# Patient Record
Sex: Male | Born: 1970 | Race: White | Hispanic: No | Smoking: Former smoker
Health system: Southern US, Community
[De-identification: ages and names within clinical notes are randomized; demographics above are authoritative.]

## PROBLEM LIST (undated history)

## (undated) DIAGNOSIS — M1990 Unspecified osteoarthritis, unspecified site: Secondary | ICD-10-CM

## (undated) DIAGNOSIS — E119 Type 2 diabetes mellitus without complications: Secondary | ICD-10-CM

## (undated) DIAGNOSIS — I10 Essential (primary) hypertension: Secondary | ICD-10-CM

## (undated) DIAGNOSIS — I219 Acute myocardial infarction, unspecified: Secondary | ICD-10-CM

## (undated) DIAGNOSIS — I2119 ST elevation (STEMI) myocardial infarction involving other coronary artery of inferior wall: Secondary | ICD-10-CM

## (undated) DIAGNOSIS — R0981 Nasal congestion: Secondary | ICD-10-CM

## (undated) DIAGNOSIS — I2510 Atherosclerotic heart disease of native coronary artery without angina pectoris: Secondary | ICD-10-CM

## (undated) DIAGNOSIS — R7303 Prediabetes: Secondary | ICD-10-CM

## (undated) DIAGNOSIS — I340 Nonrheumatic mitral (valve) insufficiency: Secondary | ICD-10-CM

## (undated) DIAGNOSIS — Z951 Presence of aortocoronary bypass graft: Secondary | ICD-10-CM

## (undated) DIAGNOSIS — Z8249 Family history of ischemic heart disease and other diseases of the circulatory system: Secondary | ICD-10-CM

## (undated) DIAGNOSIS — E785 Hyperlipidemia, unspecified: Secondary | ICD-10-CM

## (undated) HISTORY — DX: Type 2 diabetes mellitus without complications: E11.9

## (undated) HISTORY — DX: Hyperlipidemia, unspecified: E78.5

## (undated) HISTORY — DX: Acute myocardial infarction, unspecified: I21.9

## (undated) HISTORY — PX: CARDIAC CATHETERIZATION: SHX172

## (undated) HISTORY — PX: CARPAL TUNNEL RELEASE: SHX101

## (undated) HISTORY — PX: WISDOM TOOTH EXTRACTION: SHX21

## (undated) HISTORY — DX: ST elevation (STEMI) myocardial infarction involving other coronary artery of inferior wall: I21.19

## (undated) HISTORY — DX: Nonrheumatic mitral (valve) insufficiency: I34.0

## (undated) HISTORY — DX: Family history of ischemic heart disease and other diseases of the circulatory system: Z82.49

---

## 2017-04-25 ENCOUNTER — Encounter (HOSPITAL_COMMUNITY): Payer: Self-pay | Admitting: *Deleted

## 2017-04-25 ENCOUNTER — Encounter (HOSPITAL_COMMUNITY)
Admission: EM | Disposition: A | Discharge status: Home / Self Care | Payer: Self-pay | Source: Home / Self Care | Attending: Interventional Cardiology

## 2017-04-25 ENCOUNTER — Inpatient Hospital Stay (HOSPITAL_COMMUNITY)
Admission: EM | Admit: 2017-04-25 | Discharge: 2017-04-27 | DRG: 247 | Disposition: A | Discharge status: Home / Self Care | Payer: BLUE CROSS/BLUE SHIELD | Attending: Interventional Cardiology | Admitting: Interventional Cardiology

## 2017-04-25 DIAGNOSIS — I2510 Atherosclerotic heart disease of native coronary artery without angina pectoris: Secondary | ICD-10-CM | POA: Diagnosis not present

## 2017-04-25 DIAGNOSIS — Z23 Encounter for immunization: Secondary | ICD-10-CM

## 2017-04-25 DIAGNOSIS — E119 Type 2 diabetes mellitus without complications: Secondary | ICD-10-CM | POA: Diagnosis not present

## 2017-04-25 DIAGNOSIS — I2119 ST elevation (STEMI) myocardial infarction involving other coronary artery of inferior wall: Principal | ICD-10-CM | POA: Diagnosis present

## 2017-04-25 DIAGNOSIS — Z955 Presence of coronary angioplasty implant and graft: Secondary | ICD-10-CM

## 2017-04-25 DIAGNOSIS — I219 Acute myocardial infarction, unspecified: Secondary | ICD-10-CM

## 2017-04-25 DIAGNOSIS — Z8249 Family history of ischemic heart disease and other diseases of the circulatory system: Secondary | ICD-10-CM

## 2017-04-25 DIAGNOSIS — E785 Hyperlipidemia, unspecified: Secondary | ICD-10-CM

## 2017-04-25 DIAGNOSIS — F17290 Nicotine dependence, other tobacco product, uncomplicated: Secondary | ICD-10-CM | POA: Diagnosis not present

## 2017-04-25 DIAGNOSIS — I252 Old myocardial infarction: Secondary | ICD-10-CM | POA: Diagnosis not present

## 2017-04-25 DIAGNOSIS — Z716 Tobacco abuse counseling: Secondary | ICD-10-CM

## 2017-04-25 HISTORY — PX: LEFT HEART CATH AND CORONARY ANGIOGRAPHY: CATH118249

## 2017-04-25 HISTORY — DX: Family history of ischemic heart disease and other diseases of the circulatory system: Z82.49

## 2017-04-25 HISTORY — DX: Hyperlipidemia, unspecified: E78.5

## 2017-04-25 HISTORY — DX: ST elevation (STEMI) myocardial infarction involving other coronary artery of inferior wall: I21.19

## 2017-04-25 HISTORY — DX: Acute myocardial infarction, unspecified: I21.9

## 2017-04-25 HISTORY — PX: CORONARY/GRAFT ACUTE MI REVASCULARIZATION: CATH118305

## 2017-04-25 LAB — COMPREHENSIVE METABOLIC PANEL
ALK PHOS: 75 U/L (ref 38–126)
ALT: 41 U/L (ref 17–63)
AST: 98 U/L — AB (ref 15–41)
Albumin: 3.6 g/dL (ref 3.5–5.0)
Anion gap: 12 (ref 5–15)
BUN: 13 mg/dL (ref 6–20)
CALCIUM: 8.1 mg/dL — AB (ref 8.9–10.3)
CHLORIDE: 100 mmol/L — AB (ref 101–111)
CO2: 19 mmol/L — AB (ref 22–32)
CREATININE: 1.1 mg/dL (ref 0.61–1.24)
GFR calc non Af Amer: >60 mL/min (ref >60)
GLUCOSE: 251 mg/dL — AB (ref 65–99)
Potassium: 3.5 mmol/L (ref 3.5–5.1)
SODIUM: 131 mmol/L — AB (ref 135–145)
Total Bilirubin: 0.8 mg/dL (ref 0.3–1.2)
Total Protein: 5.7 g/dL — ABNORMAL LOW (ref 6.5–8.1)

## 2017-04-25 LAB — CBC
HCT: 38.2 % — ABNORMAL LOW (ref 39.0–52.0)
HCT: 38.7 % — ABNORMAL LOW (ref 39.0–52.0)
HEMOGLOBIN: 13.9 g/dL (ref 13.0–17.0)
Hemoglobin: 13 g/dL (ref 13.0–17.0)
MCH: 32.2 pg (ref 26.0–34.0)
MCH: 32.7 pg (ref 26.0–34.0)
MCHC: 34 g/dL (ref 30.0–36.0)
MCHC: 35.9 g/dL (ref 30.0–36.0)
MCV: 94.6 fL (ref 78.0–100.0)
MCV: 91.1 fL (ref 78.0–100.0)
PLATELETS: 189 10*3/uL (ref 150–400)
Platelets: 189 10*3/uL (ref 150–400)
RBC: 4.04 MIL/uL — AB (ref 4.22–5.81)
RBC: 4.25 MIL/uL (ref 4.22–5.81)
RDW: 11.9 % (ref 11.5–15.5)
RDW: 12 % (ref 11.5–15.5)
WBC: 8 10*3/uL (ref 4.0–10.5)
WBC: 10 10*3/uL (ref 4.0–10.5)

## 2017-04-25 LAB — LIPID PANEL
Cholesterol: 201 mg/dL — ABNORMAL HIGH (ref 0–200)
HDL: 24 mg/dL — ABNORMAL LOW (ref >40)
LDL CALC: UNDETERMINED mg/dL (ref 0–99)
Total CHOL/HDL Ratio: 8.4 RATIO
Triglycerides: 507 mg/dL — ABNORMAL HIGH (ref <150)
VLDL: UNDETERMINED mg/dL (ref 0–40)

## 2017-04-25 LAB — CREATININE, SERUM
CREATININE: 1.2 mg/dL (ref 0.61–1.24)
GFR calc Af Amer: >60 mL/min (ref >60)
GFR calc non Af Amer: >60 mL/min (ref >60)

## 2017-04-25 LAB — APTT

## 2017-04-25 LAB — TROPONIN I: TROPONIN I: 9.2 ng/mL — AB (ref <0.03)

## 2017-04-25 LAB — PROTIME-INR
INR: 1.17
PROTHROMBIN TIME: 14.8 s (ref 11.4–15.2)

## 2017-04-25 LAB — MRSA PCR SCREENING: MRSA by PCR: NEGATIVE

## 2017-04-25 LAB — HEMOGLOBIN A1C
HEMOGLOBIN A1C: 8.3 % — AB (ref 4.8–5.6)
MEAN PLASMA GLUCOSE: 191.51 mg/dL

## 2017-04-25 SURGERY — CORONARY/GRAFT ACUTE MI REVASCULARIZATION
Anesthesia: LOCAL

## 2017-04-25 MED ORDER — MORPHINE SULFATE (PF) 4 MG/ML IV SOLN
2.0000 mg | Freq: Four times a day (QID) | INTRAVENOUS | Status: DC | PRN
  Administered 2017-04-25 (×2): 2 mg via INTRAVENOUS
  Filled 2017-04-25 (×2): qty 1

## 2017-04-25 MED ORDER — HEPARIN (PORCINE) IN NACL 2-0.9 UNIT/ML-% IJ SOLN
INTRAMUSCULAR | Status: DC | PRN
  Administered 2017-04-25: 10 mL via INTRA_ARTERIAL

## 2017-04-25 MED ORDER — LABETALOL HCL 5 MG/ML IV SOLN: 10.0000 mg | INTRAVENOUS | Status: AC | PRN

## 2017-04-25 MED ORDER — MIDAZOLAM HCL 2 MG/2ML IJ SOLN
INTRAMUSCULAR | Status: AC
  Filled 2017-04-25: qty 2

## 2017-04-25 MED ORDER — NITROGLYCERIN 1 MG/10 ML FOR IR/CATH LAB
INTRA_ARTERIAL | Status: AC
  Filled 2017-04-25: qty 10

## 2017-04-25 MED ORDER — HEPARIN (PORCINE) IN NACL 2-0.9 UNIT/ML-% IJ SOLN
INTRAMUSCULAR | Status: AC | PRN
  Administered 2017-04-25: 1000 mL

## 2017-04-25 MED ORDER — FENTANYL CITRATE (PF) 100 MCG/2ML IJ SOLN
INTRAMUSCULAR | Status: AC
  Filled 2017-04-25: qty 2

## 2017-04-25 MED ORDER — ASPIRIN 81 MG PO CHEW
81.0000 mg | CHEWABLE_TABLET | Freq: Every day | ORAL | Status: DC
  Administered 2017-04-26 – 2017-04-27 (×2): 81 mg via ORAL
  Filled 2017-04-25 (×2): qty 1

## 2017-04-25 MED ORDER — LIDOCAINE HCL (PF) 1 % IJ SOLN
INTRAMUSCULAR | Status: DC | PRN
  Administered 2017-04-25: 2 mL

## 2017-04-25 MED ORDER — IOPAMIDOL (ISOVUE-370) INJECTION 76%
INTRAVENOUS | Status: DC | PRN
  Administered 2017-04-25: 175 mL

## 2017-04-25 MED ORDER — HEPARIN SODIUM (PORCINE) 1000 UNIT/ML IJ SOLN
INTRAMUSCULAR | Status: DC | PRN
  Administered 2017-04-25: 12000 [IU] via INTRAVENOUS
  Administered 2017-04-25: 2500 [IU] via INTRAVENOUS

## 2017-04-25 MED ORDER — ONDANSETRON HCL 4 MG/2ML IJ SOLN: 4.0000 mg | Freq: Four times a day (QID) | INTRAMUSCULAR | Status: DC | PRN

## 2017-04-25 MED ORDER — NITROGLYCERIN 1 MG/10 ML FOR IR/CATH LAB
INTRA_ARTERIAL | Status: DC | PRN
  Administered 2017-04-25: 200 ug via INTRACORONARY

## 2017-04-25 MED ORDER — HEPARIN (PORCINE) IN NACL 2-0.9 UNIT/ML-% IJ SOLN
INTRAMUSCULAR | Status: AC
  Filled 2017-04-25: qty 1500

## 2017-04-25 MED ORDER — SODIUM CHLORIDE 0.9% FLUSH
3.0000 mL | Freq: Two times a day (BID) | INTRAVENOUS | Status: DC
  Administered 2017-04-26 – 2017-04-27 (×2): 3 mL via INTRAVENOUS

## 2017-04-25 MED ORDER — HYDRALAZINE HCL 20 MG/ML IJ SOLN: 5.0000 mg | INTRAMUSCULAR | Status: AC | PRN

## 2017-04-25 MED ORDER — TICAGRELOR 90 MG PO TABS
ORAL_TABLET | ORAL | Status: DC | PRN
  Administered 2017-04-25: 180 mg via ORAL

## 2017-04-25 MED ORDER — OXYCODONE HCL 5 MG PO TABS
5.0000 mg | ORAL_TABLET | ORAL | Status: DC | PRN
  Administered 2017-04-25 (×2): 10 mg via ORAL
  Filled 2017-04-25 (×2): qty 2

## 2017-04-25 MED ORDER — SODIUM CHLORIDE 0.9% FLUSH: 3.0000 mL | INTRAVENOUS | Status: DC | PRN

## 2017-04-25 MED ORDER — INFLUENZA VAC SPLIT QUAD 0.5 ML IM SUSY: 0.5000 mL | PREFILLED_SYRINGE | INTRAMUSCULAR | Status: DC

## 2017-04-25 MED ORDER — FENTANYL CITRATE (PF) 100 MCG/2ML IJ SOLN
INTRAMUSCULAR | Status: DC | PRN
  Administered 2017-04-25: 25 ug via INTRAVENOUS
  Administered 2017-04-25 (×2): 50 ug via INTRAVENOUS

## 2017-04-25 MED ORDER — SODIUM CHLORIDE 0.9 % WEIGHT BASED INFUSION: 0.5000 mL/kg/h | INTRAVENOUS | Status: AC

## 2017-04-25 MED ORDER — ATORVASTATIN CALCIUM 80 MG PO TABS
80.0000 mg | ORAL_TABLET | Freq: Every day | ORAL | Status: DC
  Administered 2017-04-25 – 2017-04-26 (×2): 80 mg via ORAL
  Filled 2017-04-25 (×2): qty 1

## 2017-04-25 MED ORDER — IOPAMIDOL (ISOVUE-370) INJECTION 76%
INTRAVENOUS | Status: AC
  Filled 2017-04-25: qty 125

## 2017-04-25 MED ORDER — MIDAZOLAM HCL 2 MG/2ML IJ SOLN
INTRAMUSCULAR | Status: DC | PRN
  Administered 2017-04-25 (×3): 1 mg via INTRAVENOUS

## 2017-04-25 MED ORDER — VERAPAMIL HCL 2.5 MG/ML IV SOLN
INTRAVENOUS | Status: AC
  Filled 2017-04-25: qty 2

## 2017-04-25 MED ORDER — SODIUM CHLORIDE 0.9 % IV SOLN: 250.0000 mL | INTRAVENOUS | Status: DC | PRN

## 2017-04-25 MED ORDER — CARVEDILOL 3.125 MG PO TABS
3.1250 mg | ORAL_TABLET | Freq: Two times a day (BID) | ORAL | Status: DC
  Administered 2017-04-25: 3.125 mg via ORAL
  Filled 2017-04-25: qty 1

## 2017-04-25 MED ORDER — HEPARIN SODIUM (PORCINE) 5000 UNIT/ML IJ SOLN
5000.0000 [IU] | Freq: Three times a day (TID) | INTRAMUSCULAR | Status: DC
  Administered 2017-04-25 – 2017-04-27 (×5): 5000 [IU] via SUBCUTANEOUS
  Filled 2017-04-25 (×5): qty 1

## 2017-04-25 MED ORDER — TICAGRELOR 90 MG PO TABS
ORAL_TABLET | ORAL | Status: AC
  Filled 2017-04-25: qty 2

## 2017-04-25 MED ORDER — HEPARIN SODIUM (PORCINE) 1000 UNIT/ML IJ SOLN
INTRAMUSCULAR | Status: AC
  Filled 2017-04-25: qty 1

## 2017-04-25 MED ORDER — LIDOCAINE HCL (PF) 1 % IJ SOLN
INTRAMUSCULAR | Status: AC
  Filled 2017-04-25: qty 30

## 2017-04-25 MED ORDER — ACETAMINOPHEN 325 MG PO TABS
650.0000 mg | ORAL_TABLET | ORAL | Status: DC | PRN
  Administered 2017-04-25: 650 mg via ORAL
  Filled 2017-04-25: qty 2

## 2017-04-25 MED ORDER — TICAGRELOR 90 MG PO TABS
90.0000 mg | ORAL_TABLET | Freq: Two times a day (BID) | ORAL | Status: DC
  Administered 2017-04-25 – 2017-04-27 (×4): 90 mg via ORAL
  Filled 2017-04-25 (×4): qty 1

## 2017-04-25 SURGICAL SUPPLY — 19 items
BALLN SAPPHIRE 2.5X12 (BALLOONS) ×2
BALLN SAPPHIRE ~~LOC~~ 3.25X12 (BALLOONS) ×2 IMPLANT
BALLOON SAPPHIRE 2.5X12 (BALLOONS) ×1 IMPLANT
CATH INFINITI JR4 5F (CATHETERS) ×2 IMPLANT
CATH LAUNCHER 6FR JR4 (CATHETERS) ×2 IMPLANT
CATH VISTA GUIDE 6FR XB3.5 (CATHETERS) ×2 IMPLANT
COVER PRB 48X5XTLSCP FOLD TPE (BAG) ×1 IMPLANT
COVER PROBE 5X48 (BAG) ×1
DEVICE RAD COMP TR BAND LRG (VASCULAR PRODUCTS) ×2 IMPLANT
GLIDESHEATH SLEND A-KIT 6F 22G (SHEATH) ×2 IMPLANT
GUIDEWIRE INQWIRE 1.5J.035X260 (WIRE) ×1 IMPLANT
INQWIRE 1.5J .035X260CM (WIRE) ×2
KIT ENCORE 26 ADVANTAGE (KITS) ×2 IMPLANT
KIT HEART LEFT (KITS) ×2 IMPLANT
PACK CARDIAC CATHETERIZATION (CUSTOM PROCEDURE TRAY) ×2 IMPLANT
STENT PROMUS PREM MR 3.0X20 (Permanent Stent) ×2 IMPLANT
TRANSDUCER W/STOPCOCK (MISCELLANEOUS) ×2 IMPLANT
TUBING CIL FLEX 10 FLL-RA (TUBING) ×2 IMPLANT
WIRE ASAHI PROWATER 180CM (WIRE) ×6 IMPLANT

## 2017-04-25 NOTE — Progress Notes (Signed)
Pt continues having 4/10 intermittent CP which he describes as "achey". VSS. No EKG changes. Given PRN PO meds. APP paged.

## 2017-04-25 NOTE — H&P (Signed)
Cardiology Admission History and Physical:   Patient ID: Adrian Graham; MRN: [id number]; DOB: 1970/12/20   Admission date: 04/25/2017  Primary Care Provider: No primary care provider on file. Primary Cardiologist: Mendel RyderH.  Nysir Fergusson (new) Primary Electrophysiologist: None  Chief Complaint: Chest pain  Patient Profile:   Adrian Graham is a 46 y.o. male with a history of acute inferolateral myocardial infarction on presentation with no prior significant medical illnesses and on no medical therapy.  Significant family history of premature ischemic heart disease with father dying at age 45 and her brother suffering a myocardial infarction in his late 7330s.  History of Present Illness:   Adrian Graham was with his brother hunting and just positioned himself in his deer stand when he developed chest discomfort at around 6:30 AM.  EMS was called and initial EKG demonstrated inferolateral ST segment elevation.  He was transported to the emergency room where brief evaluation was consistent with ongoing severe pain and continued EKG changes.  The patient smokes cigarettes.  He is on no chronic medications.  He has no known medical illnesses.   History reviewed. No pertinent past medical history.  History reviewed. No pertinent surgical history.   Medications Prior to Admission: Prior to Admission medications   Not on File     Allergies:   Allergies no known allergies  Social History:   Social History   Social History  . Marital status: Single    Spouse name: N/A  . Number of children: N/A  . Years of education: N/A   Occupational History  . Not on file.   Social History Main Topics  . Smoking status: Current Every Day Smoker    Types: E-cigarettes  . Smokeless tobacco: Current User  . Alcohol use Yes     Comment: Rare  . Drug use: No  . Sexual activity: Yes   Other Topics Concern  . Not on file   Social History Narrative  . No narrative on file    Family History: The father died  of a myocardial infarction at 4846.  A brother had an MI in his late 1530s. The patient's family history is not on file.    ROS:  Please see the history of present illness.  Smokeless tobacco/vaping.  Prior tobacco use.   All other ROS reviewed and negative.     Physical Exam/Data:   Vitals:   04/25/17 0910 04/25/17 0915 04/25/17 0946 04/25/17 1000  BP: 122/83 113/78    Pulse: 67 77 81 72  Resp: (!) 22 (!) 21 14 15   SpO2: 99% 99% 100% 100%   No intake or output data in the 24 hours ending 04/25/17 1016 There were no vitals filed for this visit. There is no height or weight on file to calculate BMI.  General:  Well nourished, well developed, in severe pain, writhing on the stretcher.  Skin is cool. HEENT: normal Lymph: no adenopathy Neck: no carotid bruit or JVD Endocrine:  No thryomegaly Vascular: No carotid bruits; FA pulses 2+ bilaterally without bruits.  Radial pulses are 2+ and pedal pulses are 2+. Cardiac:  normal S1, S2; RRR; no murmur or pericardial rub is heard. Lungs:  clear to auscultation bilaterally, no wheezing, rhonchi or rales  Abd: soft, nontender, no hepatomegaly  Ext: no edema Musculoskeletal:  No deformities, BUE and BLE strength normal and equal Skin: warm and dry  Neuro:  CNs 2-12 intact, no focal abnormalities noted Psych:  Normal affect    EKG:  The ECG  that was done EMS was personally reviewed and demonstrates ST elevation 2 3 aVF V4 through V6.  Findings are compatible with inferolateral acute ischemia.  Relevant CV Studies: None available  Laboratory Data:  ChemistryNo results for input(s): NA, K, CL, CO2, GLUCOSE, BUN, CREATININE, CALCIUM, GFRNONAA, GFRAA, ANIONGAP in the last 168 hours.  No results for input(s): PROT, ALBUMIN, AST, ALT, ALKPHOS, BILITOT in the last 168 hours. Hematology  Recent Labs Lab 04/25/17 0909  WBC 8.0  RBC 4.04*  HGB 13.0  HCT 38.2*  MCV 94.6  MCH 32.2  MCHC 34.0  RDW 11.9  PLT 189   Cardiac EnzymesNo results  for input(s): TROPONINI in the last 168 hours. No results for input(s): TROPIPOC in the last 168 hours.  BNPNo results for input(s): BNP, PROBNP in the last 168 hours.  DDimer No results for input(s): DDIMER in the last 168 hours.  Radiology/Studies:  No results found.  Assessment and Plan:   1. Acute inferolateral myocardial infarction with severe ongoing pain upon arrival.  The patient was brought immediately to the Cath Lab for angiography and mechanical intervention if feasible.  Emergency consent was obtained from the patient with the knowledge that the procedure carries a risk of death, myocardial infarction, stroke, and bleeding.  Less commonly kidney injury and infection.  Onset of discomfort approximately 2 hours prior to arrival. 2. Presumed hyperlipidemia 3. Significant family history for premature atherosclerosis.  CRITICAL CARE TIME:  25 minutes  Severity of Illness: The appropriate patient status for this patient is INPATIENT. Inpatient status is judged to be reasonable and necessary in order to provide the required intensity of service to ensure the patient's safety. The patient's presenting symptoms, physical exam findings, and initial radiographic and laboratory data in the context of their chronic comorbidities is felt to place them at high risk for further clinical deterioration. Furthermore, it is not anticipated that the patient will be medically stable for discharge from the hospital within 2 midnights of admission. The following factors support the patient status of inpatient.   " The patient's presenting symptoms include ongoing chest pain. " The worrisome physical exam findings include elevated blood pressure and EKG evidence of acute inferior infarction. " The initial radiographic and laboratory data are worrisome because of none other than EKG. " The chronic co-morbidities include none recorded.   * I certify that at the point of admission it is my clinical judgment  that the patient will require inpatient hospital care spanning beyond 2 midnights from the point of admission due to high intensity of service, high risk for further deterioration and high frequency of surveillance required.*    For questions or updates, please contact [facility] Please consult [email or web address] for contact info under Cardiology/STEMI.    Signed, Lesleigh Noe, MD  04/25/2017 10:16 AM

## 2017-04-25 NOTE — Progress Notes (Signed)
Brilinta Card givenm to patient. Patient assistance paperwork at the bedside. MD to sign.

## 2017-04-26 ENCOUNTER — Other Ambulatory Visit: Payer: Self-pay

## 2017-04-26 LAB — BASIC METABOLIC PANEL
ANION GAP: 10 (ref 5–15)
BUN: 10 mg/dL (ref 6–20)
CHLORIDE: 100 mmol/L — AB (ref 101–111)
CO2: 20 mmol/L — ABNORMAL LOW (ref 22–32)
Calcium: 8.6 mg/dL — ABNORMAL LOW (ref 8.9–10.3)
Creatinine, Ser: 1.06 mg/dL (ref 0.61–1.24)
Glucose, Bld: 210 mg/dL — ABNORMAL HIGH (ref 65–99)
POTASSIUM: 3.7 mmol/L (ref 3.5–5.1)
SODIUM: 130 mmol/L — AB (ref 135–145)

## 2017-04-26 LAB — CBC
HEMATOCRIT: 37.3 % — AB (ref 39.0–52.0)
HEMOGLOBIN: 12.9 g/dL — AB (ref 13.0–17.0)
MCH: 31.9 pg (ref 26.0–34.0)
MCHC: 34.6 g/dL (ref 30.0–36.0)
MCV: 92.3 fL (ref 78.0–100.0)
Platelets: 187 10*3/uL (ref 150–400)
RBC: 4.04 MIL/uL — AB (ref 4.22–5.81)
RDW: 12.4 % (ref 11.5–15.5)
WBC: 9.9 10*3/uL (ref 4.0–10.5)

## 2017-04-26 LAB — POCT ACTIVATED CLOTTING TIME
ACTIVATED CLOTTING TIME: 445 s
Activated Clotting Time: 268 seconds

## 2017-04-26 LAB — GLUCOSE, CAPILLARY
GLUCOSE-CAPILLARY: 239 mg/dL — AB (ref 65–99)
GLUCOSE-CAPILLARY: 185 mg/dL — AB (ref 65–99)

## 2017-04-26 LAB — POCT I-STAT, CHEM 8
BUN: 16 mg/dL (ref 6–20)
CHLORIDE: 103 mmol/L (ref 101–111)
CREATININE: 1.1 mg/dL (ref 0.61–1.24)
Calcium, Ion: 1.24 mmol/L (ref 1.15–1.40)
GLUCOSE: 275 mg/dL — AB (ref 65–99)
HEMATOCRIT: 41 % (ref 39.0–52.0)
Hemoglobin: 13.9 g/dL (ref 13.0–17.0)
POTASSIUM: 3.5 mmol/L (ref 3.5–5.1)
Sodium: 136 mmol/L (ref 135–145)
TCO2: 22 mmol/L (ref 22–32)

## 2017-04-26 LAB — TROPONIN I: Troponin I: >65 ng/mL (ref <0.03)

## 2017-04-26 MED ORDER — CARVEDILOL 6.25 MG PO TABS
6.2500 mg | ORAL_TABLET | Freq: Two times a day (BID) | ORAL | Status: DC
  Administered 2017-04-26 – 2017-04-27 (×2): 6.25 mg via ORAL
  Filled 2017-04-26 (×2): qty 1

## 2017-04-26 MED ORDER — LIVING WELL WITH DIABETES BOOK
Freq: Once | Status: AC
  Administered 2017-04-26: 15:00:00
  Filled 2017-04-26: qty 1

## 2017-04-26 MED ORDER — CARVEDILOL 6.25 MG PO TABS
6.2500 mg | ORAL_TABLET | Freq: Once | ORAL | Status: AC
  Administered 2017-04-26: 6.25 mg via ORAL
  Filled 2017-04-26: qty 1

## 2017-04-26 MED ORDER — HYDROCORTISONE 1 % EX CREA
1.0000 "application " | TOPICAL_CREAM | Freq: Every day | CUTANEOUS | Status: DC | PRN
  Filled 2017-04-26: qty 28

## 2017-04-26 MED ORDER — INSULIN ASPART 100 UNIT/ML ~~LOC~~ SOLN
0.0000 [IU] | Freq: Three times a day (TID) | SUBCUTANEOUS | Status: DC
  Administered 2017-04-26: 3 [IU] via SUBCUTANEOUS
  Administered 2017-04-27 (×2): 2 [IU] via SUBCUTANEOUS

## 2017-04-26 NOTE — Progress Notes (Signed)
Patient admitted to [facility] from [facility], no complaints of chest pain or shortness of breath. VSS.  Patient oriented to unit, telemetry applied with 2 verifiers.

## 2017-04-26 NOTE — Progress Notes (Signed)
Spoke with patient on the phone this afternoon. Patient states that he has some diabetes in his family including his mother. Has never been told that he has pre-diabetes. States that he would really like to start with diet and exercise as control. Explained to him that he would need to get a PCP to follow him after discharge and would need to check blood sugars with a home blood glucose meter. Ordered a dietician consult to talk with him about meal planning ideas and what foods he can eat. Will continue to follow his blood sugars while in the hospital.   Smith Mince RN BSN CDE Diabetes Coordinator Pager: [phone]  8am-5pm

## 2017-04-26 NOTE — Progress Notes (Signed)
Progress Note  Patient Name: Adrian Graham Date of Encounter: 04/26/2017  Primary Cardiologist: Dr Katrinka BlazingSmith  Subjective   Brief dyspnea; mild residual soreness  Inpatient Medications    Scheduled Meds: . aspirin  81 mg Oral Daily  . atorvastatin  80 mg Oral q1800  . carvedilol  3.125 mg Oral BID WC  . heparin  5,000 Units Subcutaneous Q8H  . Influenza vac split quadrivalent PF  0.5 mL Intramuscular Tomorrow-1000  . sodium chloride flush  3 mL Intravenous Q12H  . ticagrelor  90 mg Oral BID   Continuous Infusions: . sodium chloride 250 mL (04/25/17 2000)   PRN Meds: sodium chloride, acetaminophen, morphine injection, ondansetron (ZOFRAN) IV, oxyCODONE, sodium chloride flush   Vital Signs    Vitals:   04/26/17 0300 04/26/17 0400 04/26/17 0500 04/26/17 0700  BP:    123/76  Pulse: 82 82 64 63  Resp: 20 (!) 22 (!) 21 16  Temp: 98.5 F (36.9 C)     TempSrc: Oral     SpO2: 94% 97% 97% 97%  Weight:      Height:        Intake/Output Summary (Last 24 hours) at 04/26/2017 0815 Last data filed at 04/26/2017 0600 Gross per 24 hour  Intake 1840 ml  Output 1175 ml  Net 665 ml   Filed Weights   04/25/17 1015  Weight: 213 lb 13.5 oz (97 kg)    Telemetry    Sinus with 3 beats NSVT- Personally Reviewed   Physical Exam   GEN: No acute distress.   Neck: No JVD Cardiac: RRR, no murmurs, rubs, or gallops.  Respiratory: Clear to auscultation bilaterally. GI: Soft, nontender, non-distended  MS: No edema; No deformity. Radial cath site with no hematoma Neuro:  Nonfocal  Psych: Normal affect   Labs    Chemistry Recent Labs  Lab 04/25/17 0909 04/25/17 1114 04/26/17 0347  NA 131*  --  130*  K 3.5  --  3.7  CL 100*  --  100*  CO2 19*  --  20*  GLUCOSE 251*  --  210*  BUN 13  --  10  CREATININE 1.10 1.20 1.06  CALCIUM 8.1*  --  8.6*  PROT 5.7*  --   --   ALBUMIN 3.6  --   --   AST 98*  --   --   ALT 41  --   --   ALKPHOS 75  --   --   BILITOT 0.8  --   --     GFRNONAA >60 >60 >60  GFRAA >60 >60 >60  ANIONGAP 12  --  10     Hematology Recent Labs  Lab 04/25/17 0909 04/25/17 1114 04/26/17 0347  WBC 8.0 10.0 9.9  RBC 4.04* 4.25 4.04*  HGB 13.0 13.9 12.9*  HCT 38.2* 38.7* 37.3*  MCV 94.6 91.1 92.3  MCH 32.2 32.7 31.9  MCHC 34.0 35.9 34.6  RDW 11.9 12.0 12.4  PLT 189 189 187    Cardiac Enzymes Recent Labs  Lab 04/25/17 1114 04/25/17 1805 04/25/17 2322 04/26/17 0347  TROPONINI >65.00* >65.00* >65.00* >65.00*   No results for input(s): TROPIPOC in the last 168 hours.     Patient Profile     46 y.o. male admitted with acute inferolateral myocardial infarction. Patient had emergent cardiac catheterization with PCI of proximal circumflex with drug-eluting stent. He has residual ostial 75-80 LAD, 60-65 mid LAD, 80% first diagonal and 50-70% RCA. Ejection fraction 50-55%.  Assessment &  Plan    1 acute inferior lateral myocardial infarction-patient doing well this morning. Continue aspirin, brilinta, coreg (increase to 6.25 BID) and statin. He had brief sensation of dyspnea likely secondary to brilinta. If symptoms worsen would need to consider changing to Plavix.  2 residual coronary artery disease-plan is to reassess as an outpatient. May need PCI or off pump LIMA.  3 hyperlipidemia-continue statin.  4 probable new diagnosis of diabetes mellitus-we'll ask diabetes management team to see.  5 tobacco abuse-patient counseled on discontinuing.  Transfer to telemetry. Possible discharge tomorrow morning if stable.  For questions or updates, please contact [facility] Please consult [email or web address] for contact info under Cardiology/STEMI.      Signed, Olga Millers, MD  04/26/2017, 8:15 AM

## 2017-04-26 NOTE — Progress Notes (Addendum)
Received diabetes coodinator consult.   46 y.o. male admitted with acute inferolateral myocardial infarction. Noted that patient had an emergency cardiac cath. HgbA1C is 8.3%. No known history of diabetes.   Recommend checking CBGs TID & HS and starting Novolog SENSITIVE correction scale TID & HS while in the hospital.  Will need to follow up with a PCP for glucose control.  May benefit from an oral agent for diabetes and diet control at discharge. Will continue to follow while in the hospital.  Will try to call patient on the phone this am.  Smith Mince RN BSN CDE Diabetes Coordinator Pager: [phone]  8am-5pm

## 2017-04-27 ENCOUNTER — Encounter (HOSPITAL_COMMUNITY): Payer: Self-pay | Admitting: Interventional Cardiology

## 2017-04-27 LAB — GLUCOSE, CAPILLARY
GLUCOSE-CAPILLARY: 171 mg/dL — AB (ref 65–99)
Glucose-Capillary: 193 mg/dL — ABNORMAL HIGH (ref 65–99)

## 2017-04-27 LAB — HEMOGLOBIN A1C
Hgb A1c MFr Bld: 8.3 % — ABNORMAL HIGH (ref 4.8–5.6)
MEAN PLASMA GLUCOSE: 191.51 mg/dL

## 2017-04-27 MED ORDER — METFORMIN HCL 500 MG PO TABS: 500.0000 mg | ORAL_TABLET | Freq: Two times a day (BID) | ORAL | 2 refills | Status: DC

## 2017-04-27 MED ORDER — CARVEDILOL 6.25 MG PO TABS: 6.2500 mg | ORAL_TABLET | Freq: Two times a day (BID) | ORAL | 11 refills | Status: DC

## 2017-04-27 MED ORDER — ASPIRIN 81 MG PO CHEW: 81.0000 mg | CHEWABLE_TABLET | Freq: Every day | ORAL | Status: AC

## 2017-04-27 MED ORDER — ATORVASTATIN CALCIUM 80 MG PO TABS: 80.0000 mg | ORAL_TABLET | Freq: Every day | ORAL | 6 refills | Status: DC

## 2017-04-27 MED ORDER — TICAGRELOR 90 MG PO TABS: 90.0000 mg | ORAL_TABLET | Freq: Two times a day (BID) | ORAL | 11 refills | Status: DC

## 2017-04-27 MED ORDER — NITROGLYCERIN 0.4 MG SL SUBL: 0.4000 mg | SUBLINGUAL_TABLET | SUBLINGUAL | 12 refills | Status: AC | PRN

## 2017-04-27 NOTE — Progress Notes (Signed)
Progress Note  Patient Name: Adrian Graham Date of Encounter: 04/27/2017  Primary Cardiologist: Dr. Katrinka BlazingSmith  Subjective   Feeling well. No chest pain, sob or palpitations.   Inpatient Medications    Scheduled Meds: . aspirin  81 mg Oral Daily  . atorvastatin  80 mg Oral q1800  . carvedilol  6.25 mg Oral BID WC  . heparin  5,000 Units Subcutaneous Q8H  . Influenza vac split quadrivalent PF  0.5 mL Intramuscular Tomorrow-1000  . insulin aspart  0-9 Units Subcutaneous TID WC  . sodium chloride flush  3 mL Intravenous Q12H  . ticagrelor  90 mg Oral BID   Continuous Infusions: . sodium chloride 250 mL (04/25/17 2000)   PRN Meds: sodium chloride, acetaminophen, hydrocortisone cream, morphine injection, ondansetron (ZOFRAN) IV, oxyCODONE, sodium chloride flush   Vital Signs    Vitals:   04/26/17 2040 04/27/17 0037 04/27/17 0600 04/27/17 1143  BP: 113/77 102/72 99/73 96/66   Pulse: 76 82 80 73  Resp: 18 18 18 18   Temp: 98.3 F (36.8 C) 99.1 F (37.3 C) 98.2 F (36.8 C) 98 F (36.7 C)  TempSrc: Oral Oral Oral Oral  SpO2: 100% 98% 100% 100%  Weight:   209 lb 1.6 oz (94.8 kg)   Height:        Intake/Output Summary (Last 24 hours) at 04/27/2017 1154 Last data filed at 04/27/2017 0924 Gross per 24 hour  Intake 560 ml  Output -  Net 560 ml   Filed Weights   04/25/17 1015 04/26/17 1104 04/27/17 0600  Weight: 213 lb 13.5 oz (97 kg) 211 lb 2 oz (95.8 kg) 209 lb 1.6 oz (94.8 kg)    Telemetry    SR- Personally Reviewed  ECG    N/A  Physical Exam   GEN: No acute distress.   Neck: No JVD Cardiac: RRR, no murmurs, rubs, or gallops.  Right radial cath site without hematoma Respiratory: Clear to auscultation bilaterally. GI: Soft, nontender, non-distended  MS: No edema; No deformity. Neuro:  Nonfocal  Psych: Normal affect   Labs    Chemistry Recent Labs  Lab 04/25/17 0833 04/25/17 0909 04/25/17 1114 04/26/17 0347  NA 136 131*  --  130*  K 3.5 3.5  --  3.7   CL 103 100*  --  100*  CO2  --  19*  --  20*  GLUCOSE 275* 251*  --  210*  BUN 16 13  --  10  CREATININE 1.10 1.10 1.20 1.06  CALCIUM  --  8.1*  --  8.6*  PROT  --  5.7*  --   --   ALBUMIN  --  3.6  --   --   AST  --  98*  --   --   ALT  --  41  --   --   ALKPHOS  --  75  --   --   BILITOT  --  0.8  --   --   GFRNONAA  --  >60 >60 >60  GFRAA  --  >60 >60 >60  ANIONGAP  --  12  --  10     Hematology Recent Labs  Lab 04/25/17 0909 04/25/17 1114 04/26/17 0347  WBC 8.0 10.0 9.9  RBC 4.04* 4.25 4.04*  HGB 13.0 13.9 12.9*  HCT 38.2* 38.7* 37.3*  MCV 94.6 91.1 92.3  MCH 32.2 32.7 31.9  MCHC 34.0 35.9 34.6  RDW 11.9 12.0 12.4  PLT 189 189 187    Cardiac  Enzymes Recent Labs  Lab 04/25/17 1114 04/25/17 1805 04/25/17 2322 04/26/17 0347  TROPONINI >65.00* >65.00* >65.00* >65.00*   No results for input(s): TROPIPOC in the last 168 hours.   Radiology    No results found.  Cardiac Studies   Coronary/Graft Acute MI Revascularization  LEFT HEART CATH AND CORONARY ANGIOGRAPHY  Conclusion    Acute inferior lateral ST elevation MI due to occlusion of the proximal circumflex coronary artery.  Totally occluded proximal circumflex treated with PCI and stenting using a 20 x 3.0 Synergy postdilated to 3.25 mm in diameter with 0% residual stenosis and resultant TIMI grade III flow.  Ostial 75-80% LAD.  Mid eccentric 60-65% LAD stenosis with 80% stenosis of a large first diagonal.  LAD is a large vessel that wraps around the left ventricular apex.  Codominant right coronary with distal 50-70% stenosis.  Anterolateral hypokinesis, EF 50-55% with elevated end-diastolic pressure 31 mmHg consistent with acute diastolic heart failure.  RECOMMENDATIONS:  Risk factor modification: Aggressive lipid-lowering, screening for diabetes, smoking cessation, and blood pressure control.  Eventual LAD treatment will likely be off-pump LIMA to LAD.  This could be done sometime 3-9 months  after this event assuming no recurrent angina and stable clinical course.  Prior to surgical referral, will perform a myocardial perfusion imaging study or CT FFR to document significance of LAD.  Distal right coronary disease is borderline and will also need to be evaluated if surgery is recommended.  Aspirin and Brilinta for 12 months.  Probable discharge within 48 hours.   Diagnostic Diagram       Post-Intervention Diagram          Patient Profile     46 y.o. male admitted with acute inferolateral myocardial infarction. Patient had emergent cardiac catheterization with PCI of proximal circumflex with drug-eluting stent. He has residual ostial 75-80 LAD, 60-65 mid LAD, 80% first diagonal and 50-70% RCA. Ejection fraction 50-55%.    Assessment & Plan    1. acute inferior lateral myocardial infarction - S/p DES to proximal Circumflex. Plan for stress test ~6 months follow by probably CABG (LIMA to LAD and SVG to PDA). - He has intermittent SOB on brillinta. Stable. Advised to take with coffeee. If no improvement, will change to plavix.  - Continue ASA, Brillinta, Lipitor and coreg.   2. Tobacco abuse - Advised cessation. Education given.  3. DM - new diagnosis. A1c 8.3 on SSI here. He wants to try diet and exercise. Will be followed by community health as he does not have PCP. Consider metformin.  4. HLD - 04/25/2017: Cholesterol 201; HDL 24; LDL Cholesterol UNABLE TO CALCULATE IF TRIGLYCERIDE OVER 400 mg/dL; Triglycerides 507; VLDL UNABLE TO CALCULATE IF TRIGLYCERIDE OVER 400 mg/dL  - Continue statin. LFT and lipid panel in 6 weeks.    For questions or updates, please contact [facility] Please consult [email or web address] for contact info under Cardiology/STEMI.      Signed, Manson Passey, PA  04/27/2017, 11:54 AM    Patient seen and examined. Agree with assessment and plan. No recurrent chest pain; Day 3 s/p MI and PCI to LCX. He has significant LAD and RCA disease   Now on atorvastatin 80 mg; aggressive lipid lowering. May need future CABG. Continue DAPT, BB and titrate, consider nitrates as outpatient if BP allows. F/U in office next week. No tobacco. Mild dyspnea with brilinta; continue for now. OK to dc today.    Lennette Bihari, MD, [facility] 04/27/2017 12:15 PM

## 2017-04-27 NOTE — Progress Notes (Signed)
Discharge instructions reviewed with the patient to include new medications, prescriptions, activity and follow up appointments.  Patient voices understanding to teaching.  Printed copies provided. Patient ambulated to the door.  Home via POV with his cousin driving.

## 2017-04-27 NOTE — Progress Notes (Signed)
INTERVENTIONAL CARDIOLOGY  Be sure to inform the patient that he has ostial LAD disease.  This will require attention over the next 3-6 months, probably with LIMA to LAD and SVG to PDA.  Prior to any such intervention he will have a nuclear stress test to determine if ischemia is documented.  I will discuss further with him in office.

## 2017-04-27 NOTE — Progress Notes (Signed)
Visited patient in room briefly.  Reviewed hgbA1C chart and blood sugars with him. Reviewed diabetes care plan sheet and talked about checking feet every night and needing to find a PCP to follow his diabetes care. Will continue to monitor blood sugars while in the hospital.   Smith Mince RN BSN CDE Diabetes Coordinator Pager: [phone]  8am-5pm

## 2017-04-27 NOTE — Care Management Note (Signed)
Case Management Note  Patient Details  Name: Adrian Graham MRN: [id number] Date of Birth: 08-01-1970  Subjective/Objective:      Acute ST elevation             Action/Plan: Patient is independent of his ADL's; starting a new job and his insurance benefits will be active in 3 months; he is agreeable to go to the [facility] for follow up care; Brilinta coupon card given, pharmacy of choice is [facility] in [place]; pharmacy called and they have the medication in stock.  Expected Discharge Date:    04/27/2017              Expected Discharge Plan:   Home/ self  Reola Mosher [phone] 04/27/2017, 12:07 PM

## 2017-04-27 NOTE — Discharge Summary (Signed)
Discharge Summary    Patient ID: Adrian Graham,  MRN: [id number], DOB/AGE: 1970-09-26 46 y.o.  Admit date: 04/25/2017 Discharge date: 04/27/2017  Primary Care Provider: No primary care provider on file. Primary Cardiologist: Dr. Katrinka Graham  Discharge Diagnoses    Principal Problem:   Acute ST elevation myocardial infarction (STEMI) of inferior wall (HCC) Active Problems:   Hyperlipidemia LDL goal <70   Family history of early CAD   Acute myocardial infarction (HCC)   DM Tobacco abuse    Allergies No Known Allergies  Diagnostic Studies/Procedures    Coronary/Graft Acute MI Revascularization  LEFT HEART CATH AND CORONARY ANGIOGRAPHY  Conclusion    Acute inferior lateral ST elevation MI due to occlusion of the proximal circumflex coronary artery.  Totally occluded proximal circumflex treated with PCI and stenting using a 20 x 3.0 Synergy postdilated to 3.25 mm in diameter with 0% residual stenosis and resultant TIMI grade III flow.  Ostial 75-80% LAD. Mid eccentric 60-65% LAD stenosis with 80% stenosis of a large first diagonal. LAD is a large vessel that wraps around the left ventricular apex.  Codominant right coronary with distal 50-70% stenosis.  Anterolateral hypokinesis, EF 50-55% with elevated end-diastolic pressure 31 mmHg consistent with acute diastolic heart failure.  RECOMMENDATIONS:  Risk factor modification: Aggressive lipid-lowering, screening for diabetes, smoking cessation, and blood pressure control.  Eventual LAD treatment will likely be off-pump LIMA to LAD. This could be done sometime 3-9 months after this event assuming no recurrent angina and stable clinical course. Prior to surgical referral, will perform a myocardial perfusion imaging study or CT FFR to document significance of LAD. Distal right coronary disease is borderline and will also need to be evaluated if surgery is recommended.  Aspirin and Brilinta for 12 months.  Probable  discharge within 48 hours.   Diagnostic Diagram       Post-Intervention Diagram           History of Present Illness     46 y.o.maleadmitted with acute inferolateral myocardial infarction.    Hospital Course     Consultants: None  1. Acute inferior lateral myocardial infarction - S/p DES to proximal Circumflex. Plan for stress test ~6 months follow by probably CABG (LIMA to LAD and SVG to PDA). He has intermittent SOB on brillinta. Stable. Advised to take with coffeee. If no improvement, will change to plavix. Continue ASA, Brillinta, Lipitor and coreg. No recurrent chest pain.   2. Tobacco abuse - Advised cessation. Education given.  3. DM - New diagnosis. A1c 8.3 on SSI here. He wants to try diet and exercise. Will be followed by community health as he does not have PCP. He agreed to start metformin.  4. HLD - 04/25/2017: Cholesterol 201; HDL 24; LDL Cholesterol UNABLE TO CALCULATE IF TRIGLYCERIDE OVER 400 mg/dL; Triglycerides 507; VLDL UNABLE TO CALCULATE IF TRIGLYCERIDE OVER 400 mg/dL  - Continue statin. LFT and lipid panel in 6 weeks.   Dispo: He is starting a new job and his insurance benefits will be active in 3 months. 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   The patient has been seen by [facility]  today and deemed ready for discharge home. All follow-up appointments have been scheduled. Discharge medications are listed below.    Discharge Vitals Blood pressure 96/66, pulse 73, temperature 98 F (36.7 C), temperature source Oral, resp. rate 18, height 5\' 9"  (1.753 m), weight 209 lb 1.6 oz (94.8 kg), SpO2 100 %.  Filed Weights   04/25/17  1015 04/26/17 1104 04/27/17 0600  Weight: 213 lb 13.5 oz (97 kg) 211 lb 2 oz (95.8 kg) 209 lb 1.6 oz (94.8 kg)    Labs & Radiologic Studies     CBC Recent Labs    04/25/17 1114 04/26/17 0347  WBC 10.0 9.9  HGB 13.9 12.9*  HCT 38.7* 37.3*  MCV 91.1 92.3  PLT 189 187   Basic Metabolic Panel Recent Labs     04/25/17 0909 04/25/17 1114 04/26/17 0347  NA 131*  --  130*  K 3.5  --  3.7  CL 100*  --  100*  CO2 19*  --  20*  GLUCOSE 251*  --  210*  BUN 13  --  10  CREATININE 1.10 1.20 1.06  CALCIUM 8.1*  --  8.6*   Liver Function Tests Recent Labs    04/25/17 0909  AST 98*  ALT 41  ALKPHOS 75  BILITOT 0.8  PROT 5.7*  ALBUMIN 3.6  Cardiac Enzymes Recent Labs    04/25/17 1805 04/25/17 2322 04/26/17 0347  TROPONINI >65.00* >65.00* >65.00*   Hemoglobin A1C Recent Labs    04/27/17 0336  HGBA1C 8.3*   Fasting Lipid Panel Recent Labs    04/25/17 0909  CHOL 201*  HDL 24*  LDLCALC UNABLE TO CALCULATE IF TRIGLYCERIDE OVER 400 mg/dL  TRIG [id number]*  CHOLHDL 8.4    Disposition   Pt is being discharged home today in good condition.  Follow-up Plans & Appointments    Follow-up Information    [facility]. Go on 05/06/2017.   Why:  @1 :30pm Contact information: [address] [place] [phone]       Lyn RecordsSmith, Adrian W, MD Follow up.   Specialty:  Cardiology Why:  office will call with time and date. please give us call if did not heard in 2 days.  Contact information: [address] [place] [address] [phone]          Discharge Instructions    Amb Referral to Cardiac Rehabilitation   Complete by:  As directed    Referring to [facility] Phase 2   Diagnosis:   STEMI PTCA Coronary Stents     Diet - low sodium heart healthy   Complete by:  As directed    Discharge instructions   Complete by:  As directed    No driving for 1 week. No lifting over 10 lbs for 4 weeks. No sexual activity for 4 weeks. You may not return to work until cleared by your cardiologist or seen in clinic. Keep procedure site clean & dry. If you notice increased pain, swelling, bleeding or pus, call/return!  You may shower, but no soaking baths/hot tubs/pools for 1 week.   Increase activity slowly    Complete by:  As directed       Discharge Medications   Current Discharge Medication List    START taking these medications   Details  aspirin 81 MG chewable tablet Chew 1 tablet (81 mg total) daily by mouth.    atorvastatin (LIPITOR) 80 MG tablet Take 1 tablet (80 mg total) daily at 6 PM by mouth. Qty: 30 tablet, Refills: 6    carvedilol (COREG) 6.25 MG tablet Take 1 tablet (6.25 mg total) 2 (two) times daily with a meal by mouth. Qty: 60 tablet, Refills: 11    metFORMIN (GLUCOPHAGE) 500 MG tablet Take 1 tablet (500 mg total) 2 (two) times daily with a  meal by mouth. Needs PCP visit for further refills Qty: 60 tablet, Refills: 2    nitroGLYCERIN (NITROSTAT) 0.4 MG SL tablet Place 1 tablet (0.4 mg total) every 5 (five) minutes as needed under the tongue for chest pain. Qty: 25 tablet, Refills: 12    ticagrelor (BRILINTA) 90 MG TABS tablet Take 1 tablet (90 mg total) 2 (two) times daily by mouth. Qty: 60 tablet, Refills: 11      CONTINUE these medications which have NOT CHANGED   Details  HYDROCORTISONE, TOPICAL, 1 % GEL Apply 1 application topically daily as needed (rash on elbow).         Aspirin prescribed at discharge?  Yes High Intensity Statin Prescribed? (Lipitor 40-80mg  or Crestor 20-40mg ): Yes Beta Blocker Prescribed? Yes For EF 45% or less, Was ACEI/ARB Prescribed? BP soft to add. EF normal ADP Receptor Inhibitor Prescribed? (i.e. Plavix etc.-Includes Medically Managed Patients): Yes For EF <40%, Aldosterone Inhibitor Prescribed? Yes Was EF assessed during THIS hospitalization? Yes (50-55% by cath) Was Cardiac Rehab II ordered? (Included Medically managed Patients): Yes   Outstanding Labs/Studies   Consider OP f/u labs 6-8 weeks given statin initiation this admission.  Duration of Discharge Encounter   Greater than 30 minutes including physician time.  Signed, Lameshia Hypolite PA-C 04/27/2017, 1:46 PM

## 2017-04-27 NOTE — Progress Notes (Signed)
CARDIAC REHAB PHASE I   PRE:  Rate/Rhythm: 79 SR  BP:  Supine:   Sitting: 92/67  Standing:    SaO2: 97%RA  MODE:  Ambulation: 810 ft   POST:  Rate/Rhythm: 97 SR  BP:  Supine:   Sitting: 97/65  Standing:    SaO2: 98%RA 0920-1040 Pt walked 810 ft with steady gait and no CP. Tolerated well. MI education completed with pt who voiced understanding. Stressed importance of brilinta with stent. Pt has card at bedside and MD has seen papers for financial assistance. Pt does not know anything about either. Asked RN to have case manager see pt again. Pt knows he must be on an antiplatelet with the new stent. Reviewed MI restrictions, NTG use, counting carbs and heart healthy diets, ex ed and smoking cessation. Gave pt fake cigarette and discussed calling 1800quitnow if needed. Discussed CRP 2 and will refer to [facility]. Pt is new diabetic and we discussed carb counting. To see dietitian also. Has seen diabetic coordinator. Pt unsure at this time what will be done for remaining blockages.    Luetta Nutting, RN BSN  04/27/2017 10:36 AM

## 2017-04-28 ENCOUNTER — Telehealth: Payer: Self-pay | Admitting: Nurse Practitioner

## 2017-04-28 NOTE — Telephone Encounter (Signed)
New message    TOC appt made per staff message from [facility]. 05/11/17 with Talitha Givens at 8am.

## 2017-04-29 ENCOUNTER — Telehealth: Payer: Self-pay | Admitting: Internal Medicine

## 2017-04-29 DIAGNOSIS — E1159 Type 2 diabetes mellitus with other circulatory complications: Secondary | ICD-10-CM

## 2017-04-29 MED ORDER — METFORMIN HCL ER 500 MG PO TB24: 500.0000 mg | ORAL_TABLET | Freq: Two times a day (BID) | ORAL | 30 refills | Status: DC

## 2017-04-29 NOTE — Telephone Encounter (Signed)
Patient contacted regarding discharge from [facility] on 04/27/2017.  Patient understands to follow up with provider Norma Fredrickson, NP on 05/11/2017 at 8 am at [address] [facility]. [address] in [place]. Patient understands discharge instructions? Yes Patient understands medications and regiment? Yes Patient understands to bring all medications to this visit? Yes  The pt c/o having no appetite and of having some diarrhea since before his hospital release. He states he has never had to take medications before and that maybe why he is having no appetite and diarrhea. I have advised him that it will take sometime for his body to adjust to his medications and that since he was newly diagnosed with diabetes that he may be having these symptoms due to his diabetes or the medication Metformin. I have advised him that if his symptoms do not improve within a few days to contact his PCP. The pt does have the phone number to reach [place] at [facility] and I have advised him to call us back if he has any questions or concerns. He verbalized understanding and thanked me for calling him.

## 2017-04-29 NOTE — Telephone Encounter (Signed)
Received page from patient. Having GI distress since discharge from hospital. Having watery diarrhea every day since discharge. Vomited once today. No fevers/chills. No abdominal pain. Feeling much better now after vomiting. Took last dose of ticagrelor at 6pm. Does not think he vomited up ticagrelor. Small amount of dark reddish-tinged substance in his vomitus. Not sure if this was blood or something he ate. Thinks his GI distress is from his metformin. No chest pain or chest pressure. Taking all other meds as prescribed.  Recommended discontinuing metformin for now. Called prescription for extended release metformin XR 500mg  [facility] Patient will pick up in the AM and see if GI distress resolves. If symptoms worsen overnight or if new symptoms develop, patient will call me back or go to ED.   Rosario Jacks, MD Cardiology Fellow, PGY-5

## 2017-04-30 NOTE — Telephone Encounter (Signed)
Spoke with pt who reports he has a sore throat because he "throwed up" but actually feels much better now.  Advised of Lawson Fiscal Gerhardt's recommendation to f/u with PCP or Urgent Care if s/s continue.  Pt states understanding.

## 2017-04-30 NOTE — Telephone Encounter (Signed)
I would advise that this patient see his PCP or go to urgent care today.

## 2017-05-06 ENCOUNTER — Inpatient Hospital Stay: Payer: Self-pay

## 2017-05-11 ENCOUNTER — Ambulatory Visit (INDEPENDENT_AMBULATORY_CARE_PROVIDER_SITE_OTHER): Payer: Self-pay | Admitting: Nurse Practitioner

## 2017-05-11 ENCOUNTER — Encounter: Payer: Self-pay | Admitting: Nurse Practitioner

## 2017-05-11 VITALS — BP 100/70 | HR 82 | Ht 69.5 in | Wt 200.1 lb

## 2017-05-11 DIAGNOSIS — Z955 Presence of coronary angioplasty implant and graft: Secondary | ICD-10-CM

## 2017-05-11 DIAGNOSIS — I2119 ST elevation (STEMI) myocardial infarction involving other coronary artery of inferior wall: Secondary | ICD-10-CM

## 2017-05-11 DIAGNOSIS — E785 Hyperlipidemia, unspecified: Secondary | ICD-10-CM

## 2017-05-11 DIAGNOSIS — Z8249 Family history of ischemic heart disease and other diseases of the circulatory system: Secondary | ICD-10-CM

## 2017-05-11 LAB — BASIC METABOLIC PANEL
BUN/Creatinine Ratio: 12 (ref 9–20)
BUN: 15 mg/dL (ref 6–24)
CO2: 21 mmol/L (ref 20–29)
Calcium: 9.8 mg/dL (ref 8.7–10.2)
Chloride: 101 mmol/L (ref 96–106)
Creatinine, Ser: 1.29 mg/dL — ABNORMAL HIGH (ref 0.76–1.27)
GFR calc Af Amer: 77 mL/min/{1.73_m2} (ref >59)
GFR calc non Af Amer: 67 mL/min/{1.73_m2} (ref >59)
Glucose: 151 mg/dL — ABNORMAL HIGH (ref 65–99)
Potassium: 4.6 mmol/L (ref 3.5–5.2)
Sodium: 138 mmol/L (ref 134–144)

## 2017-05-11 LAB — CBC
Hematocrit: 39.1 % (ref 37.5–51.0)
Hemoglobin: 13.9 g/dL (ref 13.0–17.7)
MCH: 33.2 pg — ABNORMAL HIGH (ref 26.6–33.0)
MCHC: 35.5 g/dL (ref 31.5–35.7)
MCV: 93 fL (ref 79–97)
Platelets: 312 10*3/uL (ref 150–379)
RBC: 4.19 x10E6/uL (ref 4.14–5.80)
RDW: 12.4 % (ref 12.3–15.4)
WBC: 6.9 10*3/uL (ref 3.4–10.8)

## 2017-05-11 NOTE — Progress Notes (Signed)
CARDIOLOGY OFFICE NOTE  Date:  05/11/2017    Adrian Graham Date of Birth: [birth year] Medical Record #[id number]  PCP:  Patient, No Pcp Per  Cardiologist:  Tyrone SageGerhardt & Katrinka BlazingSmith Chief Complaint  Patient presents with  . Coronary Artery Disease    Post hospital visit - seen for Dr. Katrinka BlazingSmith    History of Present Illness: Adrian FortJerry Horney is a 46 y.o. male who presents today for a post hospital visit/TOC visit. Seen for Dr. Katrinka BlazingSmith.   He has a history of HLD, DM and +FH for strikingly early CAD.   Presented earlier this month with chest pain - found to have acute inferolateral MI. He had PCI of proximal circumflex with drug-eluting stent. He has residual  ostial75-80LAD,60-4965mid LAD, 80% first diagonal and 50-70% RCA. Plan is for probable CABG in 3 to 9 months. Ejection fraction 50-55%.   Called here a few days after discharge - had had diarrhea, GI distress. Vomited once. Metformin was changed to XR. He does not have a PCP.   Comes in today. Here alone. He says he is doing ok - just "need to get my diabetic medicine straightened out". He had had continued GI distress - off Metformin - now on Jardiance. Hopefully getting to PCP soon thru the Urgent Care that he has been going to. No more chest pain. Not short of breath. No bleeding or bruising. He is taking his Brilinta with some coffee - this has worked out well. Walking 20 minutes a day. Not smoking - says "he's done with that". He does not really remember what was said about the findings of his cardiac catheterization. 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   Past Medical History:  Diagnosis Date  . Acute myocardial infarction (HCC) 04/25/2017  . Acute ST elevation myocardial infarction (STEMI) of inferior wall (HCC) 04/25/2017  . DM (diabetes mellitus) (HCC)   . Family history of early CAD 04/25/2017  . Hyperlipidemia LDL goal <70 04/25/2017    Past Surgical History:  Procedure Laterality Date  . Coronary/Graft  Acute MI Revascularization N/A 04/25/2017   Performed by Lyn RecordsSmith, Henry W, MD at [facility]  . LEFT HEART CATH AND CORONARY ANGIOGRAPHY N/A 04/25/2017   Performed by Lyn RecordsSmith, Henry W, MD at [facility]     Medications: Current Meds  Medication Sig  . aspirin 81 MG chewable tablet Chew 1 tablet (81 mg total) daily by mouth.  Marland Kitchen. atorvastatin (LIPITOR) 80 MG tablet Take 1 tablet (80 mg total) daily at 6 PM by mouth.  . carvedilol (COREG) 6.25 MG tablet Take 1 tablet (6.25 mg total) 2 (two) times daily with a meal by mouth.  Marland Kitchen. JARDIANCE 10 MG TABS tablet Take 10 mg daily by mouth.   . nitroGLYCERIN (NITROSTAT) 0.4 MG SL tablet Place 1 tablet (0.4 mg total) every 5 (five) minutes as needed under the tongue for chest pain.  . ticagrelor (BRILINTA) 90 MG TABS tablet Take 1 tablet (90 mg total) 2 (two) times daily by mouth.     Allergies: No Known Allergies  Social History: The patient  reports that he quit smoking 9 days ago. His smoking use included e-cigarettes. He quit smokeless tobacco use 9 days ago. He reports that he drinks alcohol. He reports that he does not use drugs.   Family History: The patient's family history includes CAD in his brother; CAD (age of onset: 2775) in his mother; Cancer in his brother;  Heart attack in his brother and brother; Heart attack (age of onset: 44) in his father.   Review of Systems: Please see the history of present illness.   Otherwise, the review of systems is positive for none.   All other systems are reviewed and negative.   Physical Exam: VS:  BP 100/70 (BP Location: Left Arm, Patient Position: Sitting, Cuff Size: Normal)   Pulse 82   Ht 5' 9.5" (1.765 m)   Wt 200 lb 1.9 oz (90.8 kg)   SpO2 97% Comment: at rest  BMI 29.13 kg/m  .  BMI Body mass index is 29.13 kg/m.  Wt Readings from Last 3 Encounters:  05/11/17 200 lb 1.9 oz (90.8 kg)  04/27/17 209 lb 1.6 oz (94.8 kg)    General: Pleasant. Well developed, well nourished and in  no acute distress.   HEENT: Normal.  Neck: Supple, no JVD, carotid bruits, or masses noted.  Cardiac: Regular rate and rhythm. No murmurs, rubs, or gallops. No edema.  Respiratory:  Lungs are clear to auscultation bilaterally with normal work of breathing.  GI: Soft and nontender.  MS: No deformity or atrophy. Gait and ROM intact.  Skin: Warm and dry. Color is normal.  Neuro:  Strength and sensation are intact and no gross focal deficits noted.  Psych: Alert, appropriate and with normal affect.   LABORATORY DATA:  EKG:  EKG is not ordered today.  Lab Results  Component Value Date   WBC 9.9 04/26/2017   HGB 12.9 (L) 04/26/2017   HCT 37.3 (L) 04/26/2017   PLT 187 04/26/2017   GLUCOSE 210 (H) 04/26/2017   CHOL 201 (H) 04/25/2017   TRIG 507 (H) 04/25/2017   HDL 24 (L) 04/25/2017   LDLCALC UNABLE TO CALCULATE IF TRIGLYCERIDE OVER 400 mg/dL 14/78/2956   ALT 41 21/30/8657   AST 98 (H) 04/25/2017   NA 130 (L) 04/26/2017   K 3.7 04/26/2017   CL 100 (L) 04/26/2017   CREATININE 1.06 04/26/2017   BUN 10 04/26/2017   CO2 20 (L) 04/26/2017   INR 1.17 04/25/2017   HGBA1C 8.3 (H) 04/27/2017     BNP (last 3 results) No results for input(s): BNP in the last 8760 hours.  ProBNP (last 3 results) No results for input(s): PROBNP in the last 8760 hours.   Other Studies Reviewed Today: Coronary/Graft Acute MI Revascularization  LEFT HEART CATH AND CORONARY ANGIOGRAPHY 04/2017  Conclusion    Acute inferior lateral ST elevation MI due to occlusion of the proximal circumflex coronary artery.  Totally occluded proximal circumflex treated with PCI and stenting using a 20 x 3.0 Synergy postdilated to 3.25 mm in diameter with 0% residual stenosis and resultant TIMI grade III flow.  Ostial 75-80% LAD. Mid eccentric 60-65% LAD stenosis with 80% stenosis of a large first diagonal. LAD is a large vessel that wraps around the left ventricular apex.  Codominant right coronary with distal  50-70% stenosis.  Anterolateral hypokinesis, EF 50-55% with elevated end-diastolic pressure 31 mmHg consistent with acute diastolic heart failure.  RECOMMENDATIONS:  Risk factor modification: Aggressive lipid-lowering, screening for diabetes, smoking cessation, and blood pressure control.  Eventual LAD treatment will likely be off-pump LIMA to LAD. This could be done sometime 3-9 months after this event assuming no recurrent angina and stable clinical course. Prior to surgical referral, will perform a myocardial perfusion imaging study or CT FFR to document significance of LAD. Distal right coronary disease is borderline and will also need to be evaluated if  surgery is recommended.  Aspirin and Brilinta for 12 months.  Probable discharge within 48 hours.      Assessment/Plan: 1.Acute inferior lateral myocardial infarction - S/p DES to proximal Circumflex. Plan for stress test ~6 months follow by probably CABG (LIMA to LAD and SVG to PDA). He is doing well clinically. He has stopped smoking. He is walking about 20 minutes a day. He has had no recurrent chest pain. he is tolerating his Brilinta.  I have reviewed his cath findings with him today along with the proposed plan of care. He will see Dr. Katrinka Blazing in a month - arrange stress testing at that time to demonstrate ischemia from the LAD. He knows to let us know if he has recurrent symptoms. Follow up lab today. He is given the ok to start his new job as of next Monday - November 26th. Samples of Brilinta today.   2. Tobacco abuse - he has stopped smoking.   3. DM = with A1C of 8.3. Now on Jardiance with plans to get to PCP in just a couple of week.   4. HLD - will need follow up lab on return. Remains on statin therapy.   Current medicines are reviewed with the patient today.  The patient does not have concerns regarding medicines other than what has been noted above.  The following changes have been made:  See above.  Labs/ tests  ordered today include:    Orders Placed This Encounter  Procedures  . Basic metabolic panel  . CBC     Disposition:   FU with Dr. Katrinka Blazing in a month. Arrange stress testing on return. Fasting labs on return.   Patient is agreeable to this plan and will call if any problems develop in the interim.   SignedNorma Fredrickson, NP  05/11/2017 8:41 AM  [facility] [address] [place], [place]  [address] Phone: [phone] Fax: [phone]

## 2017-05-11 NOTE — Patient Instructions (Addendum)
We will be checking the following labs today - BMET and CBC   Medication Instructions:    Continue with your current medicines.     Testing/Procedures To Be Arranged:  N/A  Follow-Up:   See Dr. Katrinka Blazing in one month - he will arrange stress testing at that visit - come fasting for fasting labs    Other Special Instructions:   Congrats for not smoking!  Walking up to 30 minutes a day  Let us know if you have any recurrent chest pain  Ok to start you job next week.     If you need a refill on your cardiac medications before your next appointment, please call your pharmacy.   Call the [facility] office at [phone] if you have any questions, problems or concerns.

## 2017-05-12 ENCOUNTER — Telehealth: Payer: Self-pay | Admitting: *Deleted

## 2017-05-12 MED ORDER — CARVEDILOL 3.125 MG PO TABS: 3.1250 mg | ORAL_TABLET | Freq: Two times a day (BID) | ORAL | 3 refills | Status: DC

## 2017-05-12 NOTE — Telephone Encounter (Signed)
I spoke with pt and gave him instructions from Norma Fredrickson, NP.  For now he will cut Coreg 6.25 mg in half.  [facility] [place]

## 2017-05-12 NOTE — Telephone Encounter (Signed)
Ok to cut the Coreg to 3.125 mg BID.

## 2017-05-12 NOTE — Telephone Encounter (Signed)
I spoke with pt and reviewed lab results from yesterday with him. He reports he continues to feel light headed when standing.  He thinks he told someone at yesterday's office visit but is not sure if he discussed with Lawson Fiscal. States he has always had this but thinks it is worse since being discharged from hospital. He has no way to check BP.   I discussed orthostatic precautions with pt. I told him to make sure he stays hydrated.  I asked him to check BP if possible. I told pt I would make Norma Fredrickson, NP aware and we would call him back if there were any additional recommendations.

## 2017-06-09 DIAGNOSIS — I2510 Atherosclerotic heart disease of native coronary artery without angina pectoris: Secondary | ICD-10-CM | POA: Insufficient documentation

## 2017-06-09 DIAGNOSIS — Z9861 Coronary angioplasty status: Secondary | ICD-10-CM

## 2017-06-09 NOTE — Progress Notes (Signed)
Cardiology Office Note    Date:  06/10/2017   ID:  Adrian Graham, DOB 01-29-1971, MRN [id number]  PCP:  [facility]  Cardiologist: Lesleigh Noe, MD   Chief Complaint  Patient presents with  . Coronary Artery Disease    History of Present Illness:  Adrian Graham is a 46 y.o. male has a history of HLD, DM and +FH for strikingly early CAD, acute inferior ST elevation MI 04/25/2017 with circumflex DES April 25, 2017.  Residual LAD and RCA disease.   Adrian Graham is doing well.  No recurrence of angina.  Did not enroll in cardiac rehab as we recommended.  He is walking on his own.  Has had no limitations.  Denies shortness of breath.  He has not yet established a primary care physician but will be going to [facility] within the coming week.  He was discharged from the hospital on Metformin but it caused him to feel sick.  He was subsequently started on Jardiance 10 mg/day.  He tolerates this better.  He denies side effects to his other medical regimen which includes carvedilol 3.125 mg twice daily which was decreased from 6.25 mg twice daily because of dizziness.  Also on atorvastatin 80 mg daily, aspirin 81 mg daily and ticagrelor 90 mg twice daily.  He has not needed to use nitroglycerin.  Past Medical History:  Diagnosis Date  . Acute myocardial infarction (HCC) 04/25/2017  . Acute ST elevation myocardial infarction (STEMI) of inferior wall (HCC) 04/25/2017  . DM (diabetes mellitus) (HCC)   . Family history of early CAD 04/25/2017  . Hyperlipidemia LDL goal <70 04/25/2017    Past Surgical History:  Procedure Laterality Date  . CORONARY/GRAFT ACUTE MI REVASCULARIZATION N/A 04/25/2017   Procedure: Coronary/Graft Acute MI Revascularization;  Surgeon: Lyn Records, MD;  [facility]AS[facility] CV LAB;  Service: Cardiovascular;  Laterality: N/A;  . LEFT HEART CATH AND CORONARY ANGIOGRAPHY N/A 04/25/2017   Procedure: LEFT HEART CATH AND CORONARY ANGIOGRAPHY;   Surgeon: Lyn Records, MD;  Location: [facility] INVASIVE CV LAB;  Service: Cardiovascular;  Laterality: N/A;    Current Medications: Outpatient Medications Prior to Visit  Medication Sig Dispense Refill  . aspirin 81 MG chewable tablet Chew 1 tablet (81 mg total) daily by mouth.    Marland Kitchen atorvastatin (LIPITOR) 80 MG tablet Take 1 tablet (80 mg total) daily at 6 PM by mouth. 30 tablet 6  . carvedilol (COREG) 3.125 MG tablet Take 1 tablet (3.125 mg total) by mouth 2 (two) times daily with a meal. 60 tablet 3  . JARDIANCE 10 MG TABS tablet Take 10 mg daily by mouth.   0  . nitroGLYCERIN (NITROSTAT) 0.4 MG SL tablet Place 1 tablet (0.4 mg total) every 5 (five) minutes as needed under the tongue for chest pain. 25 tablet 12  . ticagrelor (BRILINTA) 90 MG TABS tablet Take 1 tablet (90 mg total) 2 (two) times daily by mouth. 60 tablet 11   No facility-administered medications prior to visit.      Allergies:   Patient has no known allergies.   Social History   Socioeconomic History  . Marital status: Single    Spouse name: None  . Number of children: None  . Years of education: None  . Highest education level: None  Social Needs  . Financial resource strain: None  . Food insecurity - worry: None  . Food insecurity - inability: None  . Transportation needs -  medical: None  . Transportation needs - non-medical: None  Occupational History  . None  Tobacco Use  . Smoking status: Former Smoker    Types: E-cigarettes    Last attempt to quit: 05/02/2017    Years since quitting: 0.1  . Smokeless tobacco: Former Neurosurgeon    Quit date: 05/02/2017  Substance and Sexual Activity  . Alcohol use: Yes    Comment: Rare  . Drug use: No  . Sexual activity: Yes  Other Topics Concern  . None  Social History Narrative  . None     Family History:  The patient's family history includes CAD in his brother; CAD (age of onset: 75) in his mother; Cancer in his brother; Heart attack in his brother and brother;  Heart attack (age of onset: 3) in his father.   ROS:   Please see the history of present illness.    Nausea and vomiting has resolved after starting the Metformin. All other systems reviewed and are negative.   PHYSICAL EXAM:   VS:  BP 126/80   Pulse (!) 56   Ht 5\' 9"  (1.753 m)   Wt 201 lb 12.8 oz (91.5 kg)   BMI 29.80 kg/m    GEN: Well nourished, well developed, in no acute distress  HEENT: normal  Neck: no JVD, carotid bruits, or masses Cardiac: RRR; no murmurs, rubs, or gallops,no edema  Respiratory:  clear to auscultation bilaterally, normal work of breathing GI: soft, nontender, nondistended, + BS MS: no deformity or atrophy  Skin: warm and dry, no rash Neuro:  Alert and Oriented x 3, Strength and sensation are intact Psych: euthymic mood, full affect  Wt Readings from Last 3 Encounters:  10/29/16 201 lb 12.8 oz (91.5 kg)  05/11/17 200 lb 1.9 oz (90.8 kg)  04/27/17 209 lb 1.6 oz (94.8 kg)      Studies/Labs Reviewed:   EKG:  EKG not repeated  Recent Labs: 04/25/2017: ALT 41 05/11/2017: BUN 15; Creatinine, Ser 1.29; Hemoglobin 13.9; Platelets 312; Potassium 4.6; Sodium 138   Lipid Panel    Component Value Date/Time   CHOL 201 (H) 04/25/2017 0909   TRIG 507 (H) 04/25/2017 0909   HDL 24 (L) 04/25/2017 0909   CHOLHDL 8.4 04/25/2017 0909   VLDL UNABLE TO CALCULATE IF TRIGLYCERIDE OVER 400 mg/dL 16/03/9603 5409   LDLCALC UNABLE TO CALCULATE IF TRIGLYCERIDE OVER 400 mg/dL 81/19/1478 2956    Additional studies/ records that were reviewed today include:   May 05, 2017 cardiac catheterization with coronary angiography and PCI: Coronary Diagrams   Diagnostic Diagram       Post-Intervention Diagram            ASSESSMENT:    1. Coronary artery disease involving native coronary artery of native heart without angina pectoris   2. Hyperlipidemia LDL goal <70   3. Controlled type 2 diabetes mellitus with other circulatory complication, without  long-term current use of insulin (HCC)      PLAN:  In order of problems listed above:  1. Stable without angina.  Enrolled in phase 2 cardiac rehab.  Aggressive risk factor modification to control other coronary as noted above. 2. LDL target less than 70.  Liver and lipid panel in mid January. 3. Hemoglobin A1c target less than 6.5.  Most recent hemoglobin A1c when admitted to the hospital greater than 10.  Primary care to treat glucose intolerance/diabetes.  Aerobic activity at least 30 minutes/day 3 times per week.  Carbohydrate modified diet with significant  increase in plant-based components.  Clinical follow-up in 3 months.  Phase 2 cardiac rehab.  Medication Adjustments/Labs and Tests Ordered: Current medicines are reviewed at length with the patient today.  Concerns regarding medicines are outlined above.  Medication changes, Labs and Tests ordered today are listed in the Patient Instructions below. Patient Instructions  Medication Instructions:  Your physician recommends that you continue on your current medications as directed. Please refer to the Current Medication list given to you today.  Labwork: Your physician recommends that you return for lab work in: January (lipid, liver) Make sure you are fasting for these labs.   Testing/Procedures: None  Follow-Up: Your physician recommends that you schedule a follow-up appointment in: 3 months with Dr. Katrinka BlazingSmith.    Any Other Special Instructions Will Be Listed Below (If Applicable).  Please contact Cardiac Rehab in [facility] about getting into their program for education.  [phone].   If you need a refill on your cardiac medications before your next appointment, please call your pharmacy.      Signed, Lesleigh NoeHenry W Casi Westerfeld III, MD  06/10/2017 12:28 PM    [facility] [address], [place], [place]  [address] Phone: [phone]; Fax: [phone]

## 2017-06-10 ENCOUNTER — Ambulatory Visit: Payer: BLUE CROSS/BLUE SHIELD | Admitting: Interventional Cardiology

## 2017-06-10 ENCOUNTER — Encounter: Payer: Self-pay | Admitting: Interventional Cardiology

## 2017-06-10 VITALS — BP 126/80 | HR 56 | Ht 69.0 in | Wt 201.8 lb

## 2017-06-10 DIAGNOSIS — I2510 Atherosclerotic heart disease of native coronary artery without angina pectoris: Secondary | ICD-10-CM

## 2017-06-10 DIAGNOSIS — E785 Hyperlipidemia, unspecified: Secondary | ICD-10-CM

## 2017-06-10 DIAGNOSIS — E1159 Type 2 diabetes mellitus with other circulatory complications: Secondary | ICD-10-CM

## 2017-06-10 NOTE — Patient Instructions (Signed)
Medication Instructions:  Your physician recommends that you continue on your current medications as directed. Please refer to the Current Medication list given to you today.  Labwork: Your physician recommends that you return for lab work in: January (lipid, liver) Make sure you are fasting for these labs.   Testing/Procedures: None  Follow-Up: Your physician recommends that you schedule a follow-up appointment in: 3 months with Dr. Katrinka Blazing.    Any Other Special Instructions Will Be Listed Below (If Applicable).  Please contact Cardiac Rehab in [facility] about getting into their program for education.  [phone].   If you need a refill on your cardiac medications before your next appointment, please call your pharmacy.

## 2017-07-10 ENCOUNTER — Other Ambulatory Visit: Payer: BLUE CROSS/BLUE SHIELD | Admitting: *Deleted

## 2017-07-10 DIAGNOSIS — E785 Hyperlipidemia, unspecified: Secondary | ICD-10-CM

## 2017-07-10 LAB — HEPATIC FUNCTION PANEL
ALBUMIN: 4.4 g/dL (ref 3.5–5.5)
ALK PHOS: 77 IU/L (ref 39–117)
ALT: 25 IU/L (ref 0–44)
AST: 23 IU/L (ref 0–40)
Bilirubin Total: 0.4 mg/dL (ref 0.0–1.2)
Bilirubin, Direct: 0.13 mg/dL (ref 0.00–0.40)
Total Protein: 6.4 g/dL (ref 6.0–8.5)

## 2017-07-10 LAB — LIPID PANEL
CHOLESTEROL TOTAL: 115 mg/dL (ref 100–199)
Chol/HDL Ratio: 3.3 ratio (ref 0.0–5.0)
HDL: 35 mg/dL — ABNORMAL LOW (ref >39)
LDL Calculated: 65 mg/dL (ref 0–99)
Triglycerides: 76 mg/dL (ref 0–149)
VLDL CHOLESTEROL CAL: 15 mg/dL (ref 5–40)

## 2017-07-22 ENCOUNTER — Telehealth: Payer: Self-pay | Admitting: Cardiology

## 2017-07-22 NOTE — Telephone Encounter (Signed)
Patient called stating that everytime he goes outside now since it has gotten very cold he is getting discomfort in his chest.  The chest discomfort is a completely different feeling than what he had with his MI.  Currently he denies any CP.  He says that it only occurs when it is very cold out.  I instructed him to avoid being out walking when Temp is < 35 degrees.  I instructed him to call to be seen in clinic tomorrow.  He knows if he has recurrent discomfort to come to ER.  Please get him an appt with extender tomorrow.

## 2017-07-23 ENCOUNTER — Encounter: Payer: Self-pay | Admitting: Physician Assistant

## 2017-07-23 NOTE — Telephone Encounter (Signed)
I spoke with pt. He does not want to come in today due to work.  He is requesting appointment after 10:30 tomorrow.  I scheduled pt to see B. Bhagat, PA tomorrow (2/1) at 11:30

## 2017-07-24 ENCOUNTER — Ambulatory Visit (INDEPENDENT_AMBULATORY_CARE_PROVIDER_SITE_OTHER): Payer: Self-pay | Admitting: Physician Assistant

## 2017-07-24 ENCOUNTER — Encounter: Payer: Self-pay | Admitting: Physician Assistant

## 2017-07-24 ENCOUNTER — Encounter (INDEPENDENT_AMBULATORY_CARE_PROVIDER_SITE_OTHER): Payer: Self-pay

## 2017-07-24 VITALS — BP 130/96 | HR 86 | Resp 16 | Ht 69.0 in | Wt 202.0 lb

## 2017-07-24 DIAGNOSIS — I25110 Atherosclerotic heart disease of native coronary artery with unstable angina pectoris: Secondary | ICD-10-CM

## 2017-07-24 DIAGNOSIS — E785 Hyperlipidemia, unspecified: Secondary | ICD-10-CM

## 2017-07-24 MED ORDER — ISOSORBIDE MONONITRATE ER 30 MG PO TB24: 30.0000 mg | ORAL_TABLET | Freq: Every day | ORAL | 6 refills | Status: DC

## 2017-07-24 NOTE — H&P (View-Only) (Signed)
Cardiology Office Note    Date:  07/24/2017   ID:  Adrian Graham, DOB 11/06/1970, MRN [id number]  PCP:  [facility]  Cardiologist:  Dr. Katrinka Blazing   Chief Complaint: Chest discomfort  History of Present Illness:   Adrian Graham is a 47 y.o. male has a history of HLD, DM and +FH forstrikinglyearly CAD, acute inferior ST elevation MI 04/25/2017 with circumflex DES (Residual LAD and RCA disease) presents for chest discomfort.   He was doing well on cardiac stand point when seen by Dr. Katrinka Blazing 06/10/17.  Recently noted chest discomfort when its very cold and added to my schedule. He has noted progressive worsening of chest pressure with ambulation for the past 1 month. He used to walk 30 minutes without any discomfort however he can only able to walk 1-2 blocks. Chest pressure resolves with rest however now taking longer time. He works at [employer]. He did noted some chest discomfort working inside as well. No orthopnea, LE edema, syncope, PND or melena.   Past Medical History:  Diagnosis Date  . Acute myocardial infarction (HCC) 04/25/2017  . Acute ST elevation myocardial infarction (STEMI) of inferior wall (HCC) 04/25/2017  . DM (diabetes mellitus) (HCC)   . Family history of early CAD 04/25/2017  . Hyperlipidemia LDL goal <70 04/25/2017    Past Surgical History:  Procedure Laterality Date  . CORONARY/GRAFT ACUTE MI REVASCULARIZATION N/A 04/25/2017   Procedure: Coronary/Graft Acute MI Revascularization;  Surgeon: Lyn Records, MD;  Location: [facility] INVASIVE CV LAB;  Service: Cardiovascular;  Laterality: N/A;  . LEFT HEART CATH AND CORONARY ANGIOGRAPHY N/A 04/25/2017   Procedure: LEFT HEART CATH AND CORONARY ANGIOGRAPHY;  Surgeon: Lyn Records, MD;  Location: [facility] INVASIVE CV LAB;  Service: Cardiovascular;  Laterality: N/A;    Current Medications: Prior to Admission medications   Medication Sig Start Date End Date Taking? Authorizing Provider  aspirin 81 MG chewable  tablet Chew 1 tablet (81 mg total) daily by mouth. 04/28/17   Liliahna Cudd, Sharrell Ku, PA  atorvastatin (LIPITOR) 80 MG tablet Take 1 tablet (80 mg total) daily at 6 PM by mouth. 04/27/17   Manson Passey, PA  carvedilol (COREG) 3.125 MG tablet Take 1 tablet (3.125 mg total) by mouth 2 (two) times daily with a meal. 05/12/17   Rosalio Macadamia, NP  JARDIANCE 10 MG TABS tablet Take 10 mg daily by mouth.  05/09/17   [provider]  nitroGLYCERIN (NITROSTAT) 0.4 MG SL tablet Place 1 tablet (0.4 mg total) every 5 (five) minutes as needed under the tongue for chest pain. 04/27/17   Manson Passey, PA  ticagrelor (BRILINTA) 90 MG TABS tablet Take 1 tablet (90 mg total) 2 (two) times daily by mouth. 04/27/17   Manson Passey, PA    Allergies:   Patient has no known allergies.   Social History   Socioeconomic History  . Marital status: Single    Spouse name: None  . Number of children: None  . Years of education: None  . Highest education level: None  Social Needs  . Financial resource strain: None  . Food insecurity - worry: None  . Food insecurity - inability: None  . Transportation needs - medical: None  . Transportation needs - non-medical: None  Occupational History  . None  Tobacco Use  . Smoking status: Former Smoker    Types: E-cigarettes    Last attempt to quit: 05/02/2017    Years since quitting: 0.2  . Smokeless tobacco: Former  User    Quit date: 05/02/2017  Substance and Sexual Activity  . Alcohol use: Yes    Comment: Rare  . Drug use: No  . Sexual activity: Yes  Other Topics Concern  . None  Social History Narrative  . None     Family History:  The patient's family history includes CAD in his brother; CAD (age of onset: 40) in his mother; Cancer in his brother; Heart attack in his brother and brother; Heart attack (age of onset: 60) in his father.   ROS:   Please see the history of present illness.    ROS All other systems reviewed and are  negative.   PHYSICAL EXAM:   VS:  BP (!) 130/96   Pulse 86   Resp 16   Ht 5\' 9"  (1.753 m)   Wt 202 lb (91.6 kg)   SpO2 98%   BMI 29.83 kg/m    GEN: Well nourished, well developed, in no acute distress  HEENT: normal  Neck: no JVD, carotid bruits, or masses Cardiac: RRR; no murmurs, rubs, or gallops,no edema  Respiratory:  clear to auscultation bilaterally, normal work of breathing GI: soft, nontender, nondistended, + BS MS: no deformity or atrophy  Skin: warm and dry, no rash Neuro:  Alert and Oriented x 3, Strength and sensation are intact Psych: euthymic mood, full affect  Wt Readings from Last 3 Encounters:  07/24/17 202 lb (91.6 kg)  10/29/16 201 lb 12.8 oz (91.5 kg)  05/11/17 200 lb 1.9 oz (90.8 kg)      Studies/Labs Reviewed:   EKG:  EKG is ordered today.  The ekg ordered today demonstrates Sr with non specific T wave changes in inferior leads.   Recent Labs: 05/11/2017: BUN 15; Creatinine, Ser 1.29; Hemoglobin 13.9; Platelets 312; Potassium 4.6; Sodium 138 07/10/2017: ALT 25   Lipid Panel    Component Value Date/Time   CHOL 115 07/10/2017 0805   TRIG 76 07/10/2017 0805   HDL 35 (L) 07/10/2017 0805   CHOLHDL 3.3 07/10/2017 0805   CHOLHDL 8.4 04/25/2017 0909   VLDL UNABLE TO CALCULATE IF TRIGLYCERIDE OVER 400 mg/dL 99/24/2683 4196   LDLCALC 65 07/10/2017 0805    Additional studies/ records that were reviewed today include:   Coronary/Graft Acute MI Revascularization  LEFT HEART CATH AND CORONARY ANGIOGRAPHY  Conclusion    Acute inferior lateral ST elevation MI due to occlusion of the proximal circumflex coronary artery.  Totally occluded proximal circumflex treated with PCI and stenting using a 20 x 3.0 Synergy postdilated to 3.25 mm in diameter with 0% residual stenosis and resultant TIMI grade III flow.  Ostial 75-80% LAD.  Mid eccentric 60-65% LAD stenosis with 80% stenosis of a large first diagonal.  LAD is a large vessel that wraps around the left  ventricular apex.  Codominant right coronary with distal 50-70% stenosis.  Anterolateral hypokinesis, EF 50-55% with elevated end-diastolic pressure 31 mmHg consistent with acute diastolic heart failure.  RECOMMENDATIONS:  Risk factor modification: Aggressive lipid-lowering, screening for diabetes, smoking cessation, and blood pressure control.  Eventual LAD treatment will likely be off-pump LIMA to LAD.  This could be done sometime 3-9 months after this event assuming no recurrent angina and stable clinical course.  Prior to surgical referral, will perform a myocardial perfusion imaging study or CT FFR to document significance of LAD.  Distal right coronary disease is borderline and will also need to be evaluated if surgery is recommended.  Aspirin and Brilinta for 12 months.  Probable discharge within 48 hours.       Post-Intervention Diagram            ASSESSMENT & PLAN:    1. Unstable angina with hx of CAD - Worsen in past one month. No extraneous activity. Reviewed with Dr.Smith. Will plan LHC. Concern for worsening LAD disease. Start Imdur 30mg  qd. Continue ASA, Brillinta, statin and BB. - His insurance will starts on Feb 24th, 2019. He will fill out assistance form.   2. HLD - 04/25/2017: VLDL UNABLE TO CALCULATE IF TRIGLYCERIDE OVER 400 mg/dL 0/98/1191: Cholesterol, Total 115; HDL 35; LDL Calculated 65; Triglycerides 76  - Continue statin.      Medication Adjustments/Labs and Tests Ordered: Current medicines are reviewed at length with the patient today.  Concerns regarding medicines are outlined above.  Medication changes, Labs and Tests ordered today are listed in the Patient Instructions below. There are no Patient Instructions on file for this visit.   Lorelei Pont, [place]  07/24/2017 12:13 PM    [facility] [address] [place], [place], [place]  [address] Phone: [phone]; Fax: [phone]

## 2017-07-24 NOTE — Patient Instructions (Signed)
Medication Instructions:  Your physician has recommended you make the following change in your medication  1. Start Imdur (30mg ) by mouth once daily  Labwork: Your physician recommends that you have the following lab work today: BMET, CBC, PT/INR  Testing/Procedures: Your physician has requested that you have a cardiac catheterization. Cardiac catheterization is used to diagnose and/or treat various heart conditions. Doctors may recommend this procedure for a number of different reasons. The most common reason is to evaluate chest pain. Chest pain can be a symptom of coronary artery disease (CAD), and cardiac catheterization can show whether plaque is narrowing or blocking your heart's arteries. This procedure is also used to evaluate the valves, as well as measure the blood flow and oxygen levels in different parts of your heart. For further information please visit [email or web address] Please follow instruction sheet, as given.  Follow-Up: The hospital will schedule a follow up appointment with our office depending on the outcome of your procedure.  Any Other Special Instructions Will Be Listed Below (If Applicable).  If you need a refill on your cardiac medications before your next appointment, please call your pharmacy.    [facility] CARDIOVASCULAR DIVISION [facility] OFFICE [address], [address] [place] [address] Dept: [phone] Loc: [phone]  Chanc Berkes  07/24/2017  You are scheduled for a Cardiac Catheterization on Tuesday, February 5 with Dr. Verdis Prime.  1. Please arrive at the [facility] (Main Entrance A) at [facility]: [address] [place], [place] [address] at 5:30 AM (two hours before your procedure to ensure your preparation). Free valet parking service is available.   Special note: Every effort is made to have your procedure done on time. Please understand that emergencies sometimes delay scheduled  procedures.  2. Diet: Do not eat or drink anything after midnight prior to your procedure except sips of water to take medications.  3. Labs: You will need to have blood drawn on Friday, February 1 at [facility]. [address], [place]  Open: 7:30am - 5pm    Phone: [phone]. You do not need to be fasting.  4. Medication instructions in preparation for your procedure: On the morning of your procedure, take your Aspirin 81 mg and Brilinta 90 mg and any morning medicines NOT listed above.  You may use small sips of water.  5. Plan for one night stay--bring personal belongings. 6. Bring a current list of your medications and current insurance cards. 7. You MUST have a responsible person to drive you home. 8. Someone MUST be with you the first 24 hours after you arrive home or your discharge will be delayed. 9. Please wear clothes that are easy to get on and off and wear slip-on shoes.  Thank you for allowing [place] to care for you!   -- [facility]

## 2017-07-24 NOTE — Progress Notes (Signed)
Cardiology Office Note    Date:  07/24/2017   ID:  Adrian Graham, DOB 11/06/1970, MRN [id number]  PCP:  [facility]  Cardiologist:  Dr. Katrinka Blazing   Chief Complaint: Chest discomfort  History of Present Illness:   Adrian Graham is a 47 y.o. male has a history of HLD, DM and +FH forstrikinglyearly CAD, acute inferior ST elevation MI 04/25/2017 with circumflex DES (Residual LAD and RCA disease) presents for chest discomfort.   He was doing well on cardiac stand point when seen by Dr. Katrinka Blazing 06/10/17.  Recently noted chest discomfort when its very cold and added to my schedule. He has noted progressive worsening of chest pressure with ambulation for the past 1 month. He used to walk 30 minutes without any discomfort however he can only able to walk 1-2 blocks. Chest pressure resolves with rest however now taking longer time. He works at [employer]. He did noted some chest discomfort working inside as well. No orthopnea, LE edema, syncope, PND or melena.   Past Medical History:  Diagnosis Date  . Acute myocardial infarction (HCC) 04/25/2017  . Acute ST elevation myocardial infarction (STEMI) of inferior wall (HCC) 04/25/2017  . DM (diabetes mellitus) (HCC)   . Family history of early CAD 04/25/2017  . Hyperlipidemia LDL goal <70 04/25/2017    Past Surgical History:  Procedure Laterality Date  . CORONARY/GRAFT ACUTE MI REVASCULARIZATION N/A 04/25/2017   Procedure: Coronary/Graft Acute MI Revascularization;  Surgeon: Lyn Records, MD;  Location: [facility] INVASIVE CV LAB;  Service: Cardiovascular;  Laterality: N/A;  . LEFT HEART CATH AND CORONARY ANGIOGRAPHY N/A 04/25/2017   Procedure: LEFT HEART CATH AND CORONARY ANGIOGRAPHY;  Surgeon: Lyn Records, MD;  Location: [facility] INVASIVE CV LAB;  Service: Cardiovascular;  Laterality: N/A;    Current Medications: Prior to Admission medications   Medication Sig Start Date End Date Taking? Authorizing Provider  aspirin 81 MG chewable  tablet Chew 1 tablet (81 mg total) daily by mouth. 04/28/17   Aneri Slagel, Sharrell Ku, PA  atorvastatin (LIPITOR) 80 MG tablet Take 1 tablet (80 mg total) daily at 6 PM by mouth. 04/27/17   Manson Passey, PA  carvedilol (COREG) 3.125 MG tablet Take 1 tablet (3.125 mg total) by mouth 2 (two) times daily with a meal. 05/12/17   Rosalio Macadamia, NP  JARDIANCE 10 MG TABS tablet Take 10 mg daily by mouth.  05/09/17   [provider]  nitroGLYCERIN (NITROSTAT) 0.4 MG SL tablet Place 1 tablet (0.4 mg total) every 5 (five) minutes as needed under the tongue for chest pain. 04/27/17   Manson Passey, PA  ticagrelor (BRILINTA) 90 MG TABS tablet Take 1 tablet (90 mg total) 2 (two) times daily by mouth. 04/27/17   Manson Passey, PA    Allergies:   Patient has no known allergies.   Social History   Socioeconomic History  . Marital status: Single    Spouse name: None  . Number of children: None  . Years of education: None  . Highest education level: None  Social Needs  . Financial resource strain: None  . Food insecurity - worry: None  . Food insecurity - inability: None  . Transportation needs - medical: None  . Transportation needs - non-medical: None  Occupational History  . None  Tobacco Use  . Smoking status: Former Smoker    Types: E-cigarettes    Last attempt to quit: 05/02/2017    Years since quitting: 0.2  . Smokeless tobacco: Former  User    Quit date: 05/02/2017  Substance and Sexual Activity  . Alcohol use: Yes    Comment: Rare  . Drug use: No  . Sexual activity: Yes  Other Topics Concern  . None  Social History Narrative  . None     Family History:  The patient's family history includes CAD in his brother; CAD (age of onset: 40) in his mother; Cancer in his brother; Heart attack in his brother and brother; Heart attack (age of onset: 60) in his father.   ROS:   Please see the history of present illness.    ROS All other systems reviewed and are  negative.   PHYSICAL EXAM:   VS:  BP (!) 130/96   Pulse 86   Resp 16   Ht 5\' 9"  (1.753 m)   Wt 202 lb (91.6 kg)   SpO2 98%   BMI 29.83 kg/m    GEN: Well nourished, well developed, in no acute distress  HEENT: normal  Neck: no JVD, carotid bruits, or masses Cardiac: RRR; no murmurs, rubs, or gallops,no edema  Respiratory:  clear to auscultation bilaterally, normal work of breathing GI: soft, nontender, nondistended, + BS MS: no deformity or atrophy  Skin: warm and dry, no rash Neuro:  Alert and Oriented x 3, Strength and sensation are intact Psych: euthymic mood, full affect  Wt Readings from Last 3 Encounters:  07/24/17 202 lb (91.6 kg)  10/29/16 201 lb 12.8 oz (91.5 kg)  05/11/17 200 lb 1.9 oz (90.8 kg)      Studies/Labs Reviewed:   EKG:  EKG is ordered today.  The ekg ordered today demonstrates Sr with non specific T wave changes in inferior leads.   Recent Labs: 05/11/2017: BUN 15; Creatinine, Ser 1.29; Hemoglobin 13.9; Platelets 312; Potassium 4.6; Sodium 138 07/10/2017: ALT 25   Lipid Panel    Component Value Date/Time   CHOL 115 07/10/2017 0805   TRIG 76 07/10/2017 0805   HDL 35 (L) 07/10/2017 0805   CHOLHDL 3.3 07/10/2017 0805   CHOLHDL 8.4 04/25/2017 0909   VLDL UNABLE TO CALCULATE IF TRIGLYCERIDE OVER 400 mg/dL 99/24/2683 4196   LDLCALC 65 07/10/2017 0805    Additional studies/ records that were reviewed today include:   Coronary/Graft Acute MI Revascularization  LEFT HEART CATH AND CORONARY ANGIOGRAPHY  Conclusion    Acute inferior lateral ST elevation MI due to occlusion of the proximal circumflex coronary artery.  Totally occluded proximal circumflex treated with PCI and stenting using a 20 x 3.0 Synergy postdilated to 3.25 mm in diameter with 0% residual stenosis and resultant TIMI grade III flow.  Ostial 75-80% LAD.  Mid eccentric 60-65% LAD stenosis with 80% stenosis of a large first diagonal.  LAD is a large vessel that wraps around the left  ventricular apex.  Codominant right coronary with distal 50-70% stenosis.  Anterolateral hypokinesis, EF 50-55% with elevated end-diastolic pressure 31 mmHg consistent with acute diastolic heart failure.  RECOMMENDATIONS:  Risk factor modification: Aggressive lipid-lowering, screening for diabetes, smoking cessation, and blood pressure control.  Eventual LAD treatment will likely be off-pump LIMA to LAD.  This could be done sometime 3-9 months after this event assuming no recurrent angina and stable clinical course.  Prior to surgical referral, will perform a myocardial perfusion imaging study or CT FFR to document significance of LAD.  Distal right coronary disease is borderline and will also need to be evaluated if surgery is recommended.  Aspirin and Brilinta for 12 months.  Probable discharge within 48 hours.       Post-Intervention Diagram            ASSESSMENT & PLAN:    1. Unstable angina with hx of CAD - Worsen in past one month. No extraneous activity. Reviewed with Dr.Smith. Will plan LHC. Concern for worsening LAD disease. Start Imdur 30mg  qd. Continue ASA, Brillinta, statin and BB. - His insurance will starts on Feb 24th, 2019. He will fill out assistance form.   2. HLD - 04/25/2017: VLDL UNABLE TO CALCULATE IF TRIGLYCERIDE OVER 400 mg/dL 0/98/1191: Cholesterol, Total 115; HDL 35; LDL Calculated 65; Triglycerides 76  - Continue statin.      Medication Adjustments/Labs and Tests Ordered: Current medicines are reviewed at length with the patient today.  Concerns regarding medicines are outlined above.  Medication changes, Labs and Tests ordered today are listed in the Patient Instructions below. There are no Patient Instructions on file for this visit.   Lorelei Pont, [place]  07/24/2017 12:13 PM    [facility] [address] [place], [place], [place]  [address] Phone: [phone]; Fax: [phone]

## 2017-07-25 LAB — BASIC METABOLIC PANEL
BUN / CREAT RATIO: 14 (ref 9–20)
BUN: 17 mg/dL (ref 6–24)
CO2: 23 mmol/L (ref 20–29)
CREATININE: 1.18 mg/dL (ref 0.76–1.27)
Calcium: 9.6 mg/dL (ref 8.7–10.2)
Chloride: 103 mmol/L (ref 96–106)
GFR calc Af Amer: 86 mL/min/{1.73_m2} (ref >59)
GFR, EST NON AFRICAN AMERICAN: 74 mL/min/{1.73_m2} (ref >59)
Glucose: 120 mg/dL — ABNORMAL HIGH (ref 65–99)
Potassium: 4.8 mmol/L (ref 3.5–5.2)
SODIUM: 140 mmol/L (ref 134–144)

## 2017-07-25 LAB — CBC
Hematocrit: 42.7 % (ref 37.5–51.0)
Hemoglobin: 14.5 g/dL (ref 13.0–17.7)
MCH: 31.8 pg (ref 26.6–33.0)
MCHC: 34 g/dL (ref 31.5–35.7)
MCV: 94 fL (ref 79–97)
PLATELETS: 186 10*3/uL (ref 150–379)
RBC: 4.56 x10E6/uL (ref 4.14–5.80)
RDW: 12.5 % (ref 12.3–15.4)
WBC: 8.1 10*3/uL (ref 3.4–10.8)

## 2017-07-25 LAB — PROTIME-INR
INR: 1 (ref 0.8–1.2)
Prothrombin Time: 10.7 s (ref 9.1–12.0)

## 2017-07-27 ENCOUNTER — Telehealth: Payer: Self-pay | Admitting: *Deleted

## 2017-07-27 NOTE — Telephone Encounter (Signed)
Patient contacted pre-catheterization at [facility] scheduled for: Tuesday February 5,2019 7:30AM Verified arrival time and place: [facility] A/NT 5:30 AM  Confirmed no diabetes. Confirmed nothing to eat or drink after midnight tonight. Confirmed no known allergies.  Confirmed AM meds can be taken pre-cath with sip of water including: ASA 81 mg am of Brilinta 90 mg am of  Confirmed patient has responsible person to drive home post procedure and observe patient for 24 hours: yes.

## 2017-07-28 ENCOUNTER — Encounter (HOSPITAL_COMMUNITY)
Admission: RE | Disposition: A | Discharge status: Home / Self Care | Payer: Self-pay | Source: Ambulatory Visit | Attending: Interventional Cardiology

## 2017-07-28 ENCOUNTER — Encounter (HOSPITAL_COMMUNITY): Payer: Self-pay | Admitting: Interventional Cardiology

## 2017-07-28 ENCOUNTER — Other Ambulatory Visit: Payer: Self-pay | Admitting: *Deleted

## 2017-07-28 ENCOUNTER — Ambulatory Visit (HOSPITAL_COMMUNITY)
Admission: RE | Admit: 2017-07-28 | Discharge: 2017-07-28 | Disposition: A | Discharge status: Home / Self Care | Payer: Self-pay | Source: Ambulatory Visit | Attending: Interventional Cardiology | Admitting: Interventional Cardiology

## 2017-07-28 DIAGNOSIS — Z7982 Long term (current) use of aspirin: Secondary | ICD-10-CM | POA: Insufficient documentation

## 2017-07-28 DIAGNOSIS — I2510 Atherosclerotic heart disease of native coronary artery without angina pectoris: Secondary | ICD-10-CM | POA: Diagnosis present

## 2017-07-28 DIAGNOSIS — Z955 Presence of coronary angioplasty implant and graft: Secondary | ICD-10-CM | POA: Insufficient documentation

## 2017-07-28 DIAGNOSIS — I25110 Atherosclerotic heart disease of native coronary artery with unstable angina pectoris: Secondary | ICD-10-CM

## 2017-07-28 DIAGNOSIS — I252 Old myocardial infarction: Secondary | ICD-10-CM | POA: Insufficient documentation

## 2017-07-28 DIAGNOSIS — E119 Type 2 diabetes mellitus without complications: Secondary | ICD-10-CM | POA: Insufficient documentation

## 2017-07-28 DIAGNOSIS — I200 Unstable angina: Secondary | ICD-10-CM

## 2017-07-28 DIAGNOSIS — Z8249 Family history of ischemic heart disease and other diseases of the circulatory system: Secondary | ICD-10-CM | POA: Insufficient documentation

## 2017-07-28 DIAGNOSIS — Z809 Family history of malignant neoplasm, unspecified: Secondary | ICD-10-CM | POA: Insufficient documentation

## 2017-07-28 DIAGNOSIS — E785 Hyperlipidemia, unspecified: Secondary | ICD-10-CM | POA: Insufficient documentation

## 2017-07-28 DIAGNOSIS — Z79899 Other long term (current) drug therapy: Secondary | ICD-10-CM | POA: Insufficient documentation

## 2017-07-28 DIAGNOSIS — Z951 Presence of aortocoronary bypass graft: Secondary | ICD-10-CM | POA: Insufficient documentation

## 2017-07-28 DIAGNOSIS — Z87891 Personal history of nicotine dependence: Secondary | ICD-10-CM | POA: Insufficient documentation

## 2017-07-28 DIAGNOSIS — Z9861 Coronary angioplasty status: Secondary | ICD-10-CM

## 2017-07-28 HISTORY — PX: LEFT HEART CATH AND CORONARY ANGIOGRAPHY: CATH118249

## 2017-07-28 LAB — GLUCOSE, CAPILLARY: Glucose-Capillary: 147 mg/dL — ABNORMAL HIGH (ref 65–99)

## 2017-07-28 SURGERY — LEFT HEART CATH AND CORONARY ANGIOGRAPHY
Anesthesia: LOCAL

## 2017-07-28 MED ORDER — FENTANYL CITRATE (PF) 100 MCG/2ML IJ SOLN
INTRAMUSCULAR | Status: DC | PRN
  Administered 2017-07-28: 50 ug via INTRAVENOUS

## 2017-07-28 MED ORDER — ONDANSETRON HCL 4 MG/2ML IJ SOLN: 4.0000 mg | Freq: Four times a day (QID) | INTRAMUSCULAR | Status: DC | PRN

## 2017-07-28 MED ORDER — HEPARIN (PORCINE) IN NACL 2-0.9 UNIT/ML-% IJ SOLN
INTRAMUSCULAR | Status: AC | PRN
  Administered 2017-07-28: 1000 mL

## 2017-07-28 MED ORDER — LIDOCAINE HCL (PF) 1 % IJ SOLN
INTRAMUSCULAR | Status: AC
  Filled 2017-07-28: qty 30

## 2017-07-28 MED ORDER — FENTANYL CITRATE (PF) 100 MCG/2ML IJ SOLN
INTRAMUSCULAR | Status: AC
  Filled 2017-07-28: qty 2

## 2017-07-28 MED ORDER — SODIUM CHLORIDE 0.9% FLUSH
3.0000 mL | INTRAVENOUS | Status: DC | PRN
Start: 2017-07-28 — End: 2017-07-28

## 2017-07-28 MED ORDER — ASPIRIN 81 MG PO CHEW: 81.0000 mg | CHEWABLE_TABLET | ORAL | Status: DC

## 2017-07-28 MED ORDER — ACETAMINOPHEN 325 MG PO TABS: 650.0000 mg | ORAL_TABLET | ORAL | Status: DC | PRN

## 2017-07-28 MED ORDER — VERAPAMIL HCL 2.5 MG/ML IV SOLN
INTRAVENOUS | Status: DC | PRN
  Administered 2017-07-28: 10 mL via INTRA_ARTERIAL

## 2017-07-28 MED ORDER — SODIUM CHLORIDE 0.9 % WEIGHT BASED INFUSION: 1.0000 mL/kg/h | INTRAVENOUS | Status: DC

## 2017-07-28 MED ORDER — HEPARIN SODIUM (PORCINE) 1000 UNIT/ML IJ SOLN
INTRAMUSCULAR | Status: DC | PRN
  Administered 2017-07-28: 5000 [IU] via INTRAVENOUS

## 2017-07-28 MED ORDER — SODIUM CHLORIDE 0.9 % IV SOLN
250.0000 mL | INTRAVENOUS | Status: DC | PRN
Start: 2017-07-28 — End: 2017-07-28

## 2017-07-28 MED ORDER — SODIUM CHLORIDE 0.9% FLUSH: 3.0000 mL | Freq: Two times a day (BID) | INTRAVENOUS | Status: DC

## 2017-07-28 MED ORDER — MIDAZOLAM HCL 2 MG/2ML IJ SOLN
INTRAMUSCULAR | Status: AC
  Filled 2017-07-28: qty 2

## 2017-07-28 MED ORDER — SODIUM CHLORIDE 0.9 % IV SOLN: 250.0000 mL | INTRAVENOUS | Status: DC | PRN

## 2017-07-28 MED ORDER — LIDOCAINE HCL (PF) 1 % IJ SOLN
INTRAMUSCULAR | Status: DC | PRN
  Administered 2017-07-28: 2 mL

## 2017-07-28 MED ORDER — SODIUM CHLORIDE 0.9% FLUSH: 3.0000 mL | INTRAVENOUS | Status: DC | PRN

## 2017-07-28 MED ORDER — HEPARIN (PORCINE) IN NACL 2-0.9 UNIT/ML-% IJ SOLN
INTRAMUSCULAR | Status: AC
  Filled 2017-07-28: qty 1000

## 2017-07-28 MED ORDER — TICAGRELOR 90 MG PO TABS: 90.0000 mg | ORAL_TABLET | ORAL | Status: DC

## 2017-07-28 MED ORDER — SODIUM CHLORIDE 0.9 % WEIGHT BASED INFUSION
3.0000 mL/kg/h | INTRAVENOUS | Status: DC
  Administered 2017-07-28: 3 mL/kg/h via INTRAVENOUS

## 2017-07-28 MED ORDER — IOHEXOL 350 MG/ML SOLN
INTRAVENOUS | Status: DC | PRN
  Administered 2017-07-28: 80 mL via INTRA_ARTERIAL

## 2017-07-28 MED ORDER — OXYCODONE HCL 5 MG PO TABS: 5.0000 mg | ORAL_TABLET | ORAL | Status: DC | PRN

## 2017-07-28 MED ORDER — MIDAZOLAM HCL 2 MG/2ML IJ SOLN
INTRAMUSCULAR | Status: DC | PRN
  Administered 2017-07-28 (×2): 1 mg via INTRAVENOUS

## 2017-07-28 MED ORDER — HEPARIN SODIUM (PORCINE) 1000 UNIT/ML IJ SOLN
INTRAMUSCULAR | Status: AC
  Filled 2017-07-28: qty 1

## 2017-07-28 MED ORDER — VERAPAMIL HCL 2.5 MG/ML IV SOLN
INTRAVENOUS | Status: AC
  Filled 2017-07-28: qty 2

## 2017-07-28 MED ORDER — SODIUM CHLORIDE 0.9 % IV SOLN: INTRAVENOUS | Status: AC

## 2017-07-28 SURGICAL SUPPLY — 13 items
CATH 5FR JL3.5 JR4 ANG PIG MP (CATHETERS) ×2 IMPLANT
CATH LAUNCHER 5F EBU3.5 (CATHETERS) ×2 IMPLANT
COVER PRB 48X5XTLSCP FOLD TPE (BAG) ×1 IMPLANT
COVER PROBE 5X48 (BAG) ×1
DEVICE RAD COMP TR BAND LRG (VASCULAR PRODUCTS) ×2 IMPLANT
GLIDESHEATH SLEND A-KIT 6F 22G (SHEATH) ×2 IMPLANT
GUIDEWIRE INQWIRE 1.5J.035X260 (WIRE) ×1 IMPLANT
INQWIRE 1.5J .035X260CM (WIRE) ×2
KIT HEART LEFT (KITS) ×2 IMPLANT
PACK CARDIAC CATHETERIZATION (CUSTOM PROCEDURE TRAY) ×2 IMPLANT
TRANSDUCER W/STOPCOCK (MISCELLANEOUS) ×2 IMPLANT
TUBING CIL FLEX 10 FLL-RA (TUBING) ×2 IMPLANT
WIRE HI TORQ VERSACORE-J 145CM (WIRE) ×2 IMPLANT

## 2017-07-28 NOTE — Consult Note (Signed)
[address]       [place] [address]             [phone]          CARDIOTHORACIC SURGERY CONSULTATION REPORT  PCP is [facility] Referring Provider is Lyn Records, III, MD  Reason for consultation:  Severe Multi-vessel Coronary Artery Disease  HPI:  Patient is a 47 year old male with history of multivessel coronary artery disease status post acute ST segment elevation lateral wall myocardial infarction on April 25, 2017 treated with PCI and stenting of the left circumflex coronary artery using a drug eluting stent.  At the time patient had moderate ostial stenosis of the left anterior descending coronary artery and mild nonobstructive disease in the right coronary artery which was treated medically.  The patient did well and was seen in follow-up in December by Dr. Katrinka Blazing at which time he was doing well.  Since then the patient has developed progressive symptoms of exertional angina.  He was brought in for elective diagnostic catheterization earlier today and was found to have high-grade ostial stenosis of the left anterior descending coronary artery with continued patency of the stent in the left circumflex coronary artery.  The patient's LAD stenosis was felt to be high risk for PCI and elective surgical consultation was requested.  The patient is married and lives locally in [place].  He works full-time in [employer].  Prior to November the patient reported no physical limitations and has been active physically.  Cardiac risk factors include history of hyperlipidemia, type 2 diabetes mellitus, and a strong family history of coronary artery disease.  The patient states that approximately 6 weeks ago he began to experience exertional tightness across his upper chest which is usually been promptly relieved by rest.  Symptoms have progressed in severity and last week the patient had a more prolonged episode of substernal chest  discomfort which lasted between 15 and 20 minutes.  Over the past several days he has only had 2 brief episodes of chest discomfort, both of which occurred while he was working and fairly busy physically.  He has developed mild exertional shortness of breath.  He denies resting shortness of breath, PND, orthopnea, or lower extremity edema.  He has not had palpitations, dizzy spells, nor syncope.  Following his heart attack 3 months ago the patient successfully quit smoking, began walking on a regular basis, and has lost more than 20 pounds in weight by addressing his diet.  Past Medical History:  Diagnosis Date  . Acute myocardial infarction (HCC) 04/25/2017  . Acute ST elevation myocardial infarction (STEMI) of inferior wall (HCC) 04/25/2017  . DM (diabetes mellitus) (HCC)   . Family history of early CAD 04/25/2017  . Hyperlipidemia LDL goal <70 04/25/2017    Past Surgical History:  Procedure Laterality Date  . CORONARY/GRAFT ACUTE MI REVASCULARIZATION N/A 04/25/2017   Procedure: Coronary/Graft Acute MI Revascularization;  Surgeon: Lyn Records, MD;  Location: [facility] INVASIVE CV LAB;  Service: Cardiovascular;  Laterality: N/A;  . LEFT HEART CATH AND CORONARY ANGIOGRAPHY N/A 04/25/2017   Procedure: LEFT HEART CATH AND CORONARY ANGIOGRAPHY;  Surgeon: Lyn Records, MD;  Location: [facility] INVASIVE CV LAB;  Service: Cardiovascular;  Laterality: N/A;    Family History  Problem Relation Age of Onset  . CAD Mother 99       cabg 46  . Heart attack Father 62  1980  . Cancer Brother   . Heart attack Brother        deceased Nov 09, 2014  . Heart attack Brother        11/08/00  . CAD Brother     Social History   Socioeconomic History  . Marital status: Single    Spouse name: Not on file  . Number of children: Not on file  . Years of education: Not on file  . Highest education level: Not on file  Social Needs  . Financial resource strain: Not on file  . Food insecurity - worry: Not on file  . Food  insecurity - inability: Not on file  . Transportation needs - medical: Not on file  . Transportation needs - non-medical: Not on file  Occupational History  . Not on file  Tobacco Use  . Smoking status: Former Smoker    Types: E-cigarettes    Last attempt to quit: 05/02/2017    Years since quitting: 0.2  . Smokeless tobacco: Former Neurosurgeon    Quit date: 05/02/2017  Substance and Sexual Activity  . Alcohol use: Yes    Comment: Rare  . Drug use: No  . Sexual activity: Yes  Other Topics Concern  . Not on file  Social History Narrative  . Not on file    Prior to Admission medications   Medication Sig Start Date End Date Taking? Authorizing Provider  aspirin 81 MG chewable tablet Chew 1 tablet (81 mg total) daily by mouth. 04/28/17  Yes Bhagat, Bhavinkumar, PA  atorvastatin (LIPITOR) 80 MG tablet Take 1 tablet (80 mg total) daily at 6 PM by mouth. 04/27/17  Yes Bhagat, Bhavinkumar, PA  carvedilol (COREG) 3.125 MG tablet Take 1 tablet (3.125 mg total) by mouth 2 (two) times daily with a meal. 05/12/17  Yes Rosalio Macadamia, NP  isosorbide mononitrate (IMDUR) 30 MG 24 hr tablet Take 1 tablet (30 mg total) by mouth daily. 07/24/17 10/22/17 Yes Bhagat, Bhavinkumar, PA  ticagrelor (BRILINTA) 90 MG TABS tablet Take 1 tablet (90 mg total) 2 (two) times daily by mouth. 04/27/17  Yes Bhagat, Bhavinkumar, PA  nitroGLYCERIN (NITROSTAT) 0.4 MG SL tablet Place 1 tablet (0.4 mg total) every 5 (five) minutes as needed under the tongue for chest pain. 04/27/17   Manson Passey, PA    Current Facility-Administered Medications  Medication Dose Route Frequency Provider Last Rate Last Dose  . 0.9 %  sodium chloride infusion   Intravenous Continuous Lyn Records, MD 125 mL/hr at 07/28/17 [phone]    . 0.9 %  sodium chloride infusion  250 mL Intravenous PRN Lyn Records, MD      . acetaminophen (TYLENOL) tablet 650 mg  650 mg Oral Q4H PRN Lyn Records, MD      . ondansetron [facility]) injection 4 mg  4 mg  Intravenous Q6H PRN Lyn Records, MD      . oxyCODONE (Oxy IR/ROXICODONE) immediate release tablet 5-10 mg  5-10 mg Oral Q4H PRN Lyn Records, MD      . sodium chloride flush (NS) 0.9 % injection 3 mL  3 mL Intravenous Q12H Lyn Records, MD      . sodium chloride flush (NS) 0.9 % injection 3 mL  3 mL Intravenous PRN Lyn Records, MD        No Known Allergies    Review of Systems:   General:  normal appetite, normal energy, no weight gain, no weight loss, no fever  Cardiac:  +  chest pain with exertion, 1 episode chest pain at rest, + SOB with exertion, no resting SOB, no PND, no orthopnea, no palpitations, no arrhythmia, no atrial fibrillation, no LE edema, no dizzy spells, no syncope  Respiratory:  + exertional shortness of breath, no home oxygen, no productive cough, no dry cough, no bronchitis, no wheezing, no hemoptysis, no asthma, no pain with inspiration or cough, no sleep apnea, no CPAP at night  GI:   no difficulty swallowing, no reflux, no frequent heartburn, no hiatal hernia, no abdominal pain, no constipation, no diarrhea, no hematochezia, no hematemesis, no melena  GU:   no dysuria,  no frequency, no urinary tract infection, no hematuria, no enlarged prostate, no kidney stones, no kidney disease  Vascular:  no pain suggestive of claudication, no pain in feet, no leg cramps, no varicose veins, no DVT, no non-healing foot ulcer  Neuro:   no stroke, no TIA's, no seizures, no headaches, no temporary blindness one eye,  no slurred speech, no peripheral neuropathy, no chronic pain, no instability of gait, no memory/cognitive dysfunction  Musculoskeletal: no arthritis , no joint swelling, no myalgias, no difficulty walking, normal mobility   Skin:   non rash, no itching, no skin infections, no pressure sores or ulcerations  Psych:   no anxiety, no depression, no nervousness, no unusual recent stress  Eyes:   no blurry vision, no floaters, no recent vision changes, does not wears  glasses or contacts  ENT:   no hearing loss, no loose or painful teeth, no dentures  Hematologic:  no easy bruising, no abnormal bleeding, no clotting disorder, no frequent epistaxis  Endocrine:  + diabetes, does not check CBG's at home     Physical Exam:   BP 103/79   Pulse 65   Temp 97.8 F (36.6 C) (Oral)   Resp 15   Ht 5\' 9"  (1.753 m)   Wt 202 lb (91.6 kg)   SpO2 99%   BMI 29.83 kg/m   General:    well-appearing  HEENT:  Unremarkable   Neck:   no JVD, no bruits, no adenopathy   Chest:   clear to auscultation, symmetrical breath sounds, no wheezes, no rhonchi   CV:   RRR, no  murmur   Abdomen:  soft, non-tender, no masses   Extremities:  warm, well-perfused, pulses palpable, no lower extremity edema  Rectal/GU  Deferred  Neuro:   Grossly non-focal and symmetrical throughout  Skin:   Clean and dry, no rashes, no breakdown  Diagnostic Tests:  LEFT HEART CATH AND CORONARY ANGIOGRAPHY  Conclusion    Ostial to proximal 95% LAD stenosis showing evidence of progression since acute myocardial infarction in November 2018.  Widely patent left main  Widely patent circumflex stent placed during acute myocardial infarction in November 2018.  First obtuse marginal with 85% stenosis.  First obtuse marginal is jailed.  40-50% stenosis distal to stent.  Luminal irregularities in the mid and distal RCA.  Normal left ventricular systolic function.  Ejection fraction is 65%.  LVEDP is normal.  RECOMMENDATIONS:   With ostial LAD and moderate mid vessel LAD disease involving a large diagonal, I believe surgical revascularization is the patient's best option with arterial conduit to the LAD.  This could perhaps be performed off pump.  The first obtuse marginal is not large enough to bypass.  The major issue now is antiplatelet therapy to avoid stent thrombosis in preparation for bypass surgery.  He is on Brilinta.  Will need to hold  Brilinta at least 5 days.  We will get him set to  see a surgeon within the next 48 hours to establish a game plan for moving forward.  With a second generation drug-eluting stent (everolimus), it is relatively safe to discontinue Brilinta for 5 days prior to surgery without an excessive risk of acute stent thrombosis.  Indications   Accelerating angina (HCC) [I20.0 (ICD-10-CM)]  Coronary Findings   Diagnostic  Dominance: Co-dominant  Left Main  Dist LM to Ost LAD lesion 95% stenosed  Dist LM to Ost LAD lesion is 95% stenosed.  Left Anterior Descending  Prox LAD lesion 60% stenosed  Prox LAD lesion is 60% stenosed.  First Diagonal Branch  Ost 1st Diag lesion 75% stenosed  Ost 1st Diag lesion is 75% stenosed.  Second Diagonal Branch  Left Circumflex  Prox Cx to Dist Cx lesion 5% stenosed  Prox Cx to Dist Cx lesion is 5% stenosed. The lesion was previously treated.  Second Obtuse Marginal Branch  Ost 2nd Mrg lesion 80% stenosed  Ost 2nd Mrg lesion is 80% stenosed.  Right Coronary Artery  Dist RCA lesion 50% stenosed  Dist RCA lesion is 50% stenosed.  Intervention   No interventions have been documented.  Coronary Diagrams   Diagnostic Diagram       Implants     No implant documentation for this case.  MERGE Images   Show images for CARDIAC CATHETERIZATION   Link to Procedure Log   Procedure Log    Hemo Data    Most Recent Value  AO Systolic Pressure 96 mmHg  AO Diastolic Pressure 64 mmHg  AO Mean 79 mmHg  LV Systolic Pressure 87 mmHg  LV Diastolic Pressure 0 mmHg  LV EDP 3 mmHg  Arterial Occlusion Pressure Extended Systolic Pressure 81 mmHg  Arterial Occlusion Pressure Extended Diastolic Pressure 56 mmHg  Arterial Occlusion Pressure Extended Mean Pressure 69 mmHg  Left Ventricular Apex Extended Systolic Pressure 86 mmHg  Left Ventricular Apex Extended Diastolic Pressure 0 mmHg  Left Ventricular Apex Extended EDP Pressure 2 mmHg     Impression:  Patient presents with worsening symptoms of exertional  angina.  I have personally reviewed the patient's diagnostic cardiac catheterization performed earlier this morning.  He has severe multivessel coronary artery disease with critical ostial stenosis of the left anterior descending coronary artery.  There remains continued patency of the stent in the left circumflex coronary artery, and there is mild nonobstructive disease in the right coronary system.  There is mild to moderate (60%) ostial stenosis of the first diagonal branch.  Left ventricular systolic function appears to be close to normal.  I agree the patient's coronary anatomy appears relatively unfavorable for PCI and that he would probably do best long-term with surgical revascularization.  He remains anticoagulated using Brilinta, and under the circumstances I would favor delaying surgery for at least 5 days after Brilinta has been stopped as long as he remains clinically stable.  Plan:  I have reviewed the indications, risks, and potential benefits of coronary artery bypass grafting with the patient and his brother in the Short Stay unit.  Alternative treatment strategies have been discussed, including the relative risks, benefits and long term prognosis associated with medical therapy, percutaneous coronary intervention, and surgical revascularization.  The rationale for coronary artery bypass grafting and the possibility that his surgery might be performed without use of cardiopulmonary bypass was discussed.  The patient understands and accepts all potential associated risks of surgery including but not  limited to risk of death, stroke or other neurologic complication, myocardial infarction, congestive heart failure, respiratory failure, renal failure, bleeding requiring blood transfusion and/or reexploration, aortic dissection or other major vascular complication, arrhythmia, heart block or bradycardia requiring permanent pacemaker, pneumonia, pleural effusion, wound infection, pulmonary embolus or  other thromboembolic complication, chronic pain or other delayed complications related to median sternotomy, or the late recurrence of symptomatic ischemic heart disease and/or congestive heart failure.  The importance of long term risk modification have been emphasized.  All questions answered.  If the patient is felt to be stable enough for short-term hospital discharge we will tentatively plan for surgery on Thursday, August 06, 2017.  Alternatively, if the patient remains in the hospital we will try to facilitate surgery by 1 of my partners early next week.  I have instructed the patient to stop taking Brilinta 5 days prior to surgery.  I have instructed the patient that he should not go back to work, drive an automobile, nor do any type of strenuous activity.  I have instructed him to make sure that he keeps his nitroglycerin prescription close by and to call EMS or present directly to the emergency department should he develop any symptoms of chest discomfort unrelieved by administration of nitroglycerin.   I spent in excess of 90 minutes during the conduct of this hospital consultation and >50% of this time involved direct face-to-face encounter for counseling and/or coordination of the patient's care.    Salvatore Decent. Cornelius Moras, MD 07/28/2017 11:50 AM

## 2017-07-28 NOTE — Discharge Instructions (Signed)

## 2017-07-28 NOTE — Interval H&P Note (Signed)
Cath Lab Visit (complete for each Cath Lab visit)  Clinical Evaluation Leading to the Procedure:   ACS: Yes.    Non-ACS:    Anginal Classification: CCS III  Anti-ischemic medical therapy: Minimal Therapy (1 class of medications)  Non-Invasive Test Results: No non-invasive testing performed  Prior CABG: No previous CABG      History and Physical Interval Note:  07/28/2017 7:25 AM  Adrian Graham  has presented today for surgery, with the diagnosis of unstable angina  The various methods of treatment have been discussed with the patient and family. After consideration of risks, benefits and other options for treatment, the patient has consented to  Procedure(s): LEFT HEART CATH AND CORONARY ANGIOGRAPHY (N/A) as a surgical intervention .  The patient's history has been reviewed, patient examined, no change in status, stable for surgery.  I have reviewed the patient's chart and labs.  Questions were answered to the patient's satisfaction.     Lyn Records III

## 2017-07-29 ENCOUNTER — Other Ambulatory Visit: Payer: Self-pay | Admitting: Interventional Cardiology

## 2017-07-29 ENCOUNTER — Telehealth: Payer: Self-pay | Admitting: Interventional Cardiology

## 2017-07-29 ENCOUNTER — Other Ambulatory Visit: Payer: Self-pay | Admitting: *Deleted

## 2017-07-29 DIAGNOSIS — I2510 Atherosclerotic heart disease of native coronary artery without angina pectoris: Secondary | ICD-10-CM

## 2017-07-29 MED ORDER — ISOSORBIDE MONONITRATE ER 30 MG PO TB24: 60.0000 mg | ORAL_TABLET | Freq: Every day | ORAL | 6 refills | Status: DC

## 2017-07-29 NOTE — Telephone Encounter (Signed)
I spoke to the patient.  Catheterization yesterday indeed demonstrated significant progression of the ostial LAD stenosis to greater than 90%.  TIMI grade III flow was noted.  The previously stented circumflex was widely patent.  The best management strategy is LIMA to LAD given the anatomy of the stenosis the patient's young age.  He was seen by Dr. Tressie Stalker.  The patient is scheduled to have surgery on 08/06/2017.  Brilinta will be discontinued on Saturday, February 9.  I have increased isosorbide to 60 mg/day.  Cautioned the patient to use nitroglycerin liberally if even mild chest discomfort.  Instructed the patient to present to the emergency room if frequent recurrences of chest discomfort requiring nitroglycerin.

## 2017-07-30 MED FILL — Heparin Sodium (Porcine) 2 Unit/ML in Sodium Chloride 0.9%: INTRAMUSCULAR | Qty: 1000 | Status: AC

## 2017-08-01 ENCOUNTER — Encounter (HOSPITAL_COMMUNITY): Payer: Self-pay

## 2017-08-01 ENCOUNTER — Inpatient Hospital Stay (HOSPITAL_COMMUNITY): Payer: Self-pay

## 2017-08-01 ENCOUNTER — Inpatient Hospital Stay (HOSPITAL_COMMUNITY)
Admission: EM | Admit: 2017-08-01 | Discharge: 2017-08-03 | DRG: 313 | Disposition: A | Discharge status: Home / Self Care | Payer: Self-pay | Attending: Family Medicine | Admitting: Family Medicine

## 2017-08-01 DIAGNOSIS — I252 Old myocardial infarction: Secondary | ICD-10-CM

## 2017-08-01 DIAGNOSIS — Z87891 Personal history of nicotine dependence: Secondary | ICD-10-CM

## 2017-08-01 DIAGNOSIS — F419 Anxiety disorder, unspecified: Secondary | ICD-10-CM | POA: Diagnosis present

## 2017-08-01 DIAGNOSIS — Z8249 Family history of ischemic heart disease and other diseases of the circulatory system: Secondary | ICD-10-CM

## 2017-08-01 DIAGNOSIS — Z955 Presence of coronary angioplasty implant and graft: Secondary | ICD-10-CM

## 2017-08-01 DIAGNOSIS — Z7982 Long term (current) use of aspirin: Secondary | ICD-10-CM

## 2017-08-01 DIAGNOSIS — R739 Hyperglycemia, unspecified: Secondary | ICD-10-CM

## 2017-08-01 DIAGNOSIS — R0789 Other chest pain: Principal | ICD-10-CM | POA: Diagnosis present

## 2017-08-01 DIAGNOSIS — Z9861 Coronary angioplasty status: Secondary | ICD-10-CM

## 2017-08-01 DIAGNOSIS — E1165 Type 2 diabetes mellitus with hyperglycemia: Secondary | ICD-10-CM | POA: Diagnosis present

## 2017-08-01 DIAGNOSIS — E119 Type 2 diabetes mellitus without complications: Secondary | ICD-10-CM

## 2017-08-01 DIAGNOSIS — E785 Hyperlipidemia, unspecified: Secondary | ICD-10-CM | POA: Diagnosis present

## 2017-08-01 DIAGNOSIS — I2510 Atherosclerotic heart disease of native coronary artery without angina pectoris: Secondary | ICD-10-CM | POA: Diagnosis present

## 2017-08-01 DIAGNOSIS — Z79899 Other long term (current) drug therapy: Secondary | ICD-10-CM

## 2017-08-01 DIAGNOSIS — N179 Acute kidney failure, unspecified: Secondary | ICD-10-CM | POA: Diagnosis present

## 2017-08-01 DIAGNOSIS — R079 Chest pain, unspecified: Secondary | ICD-10-CM

## 2017-08-01 DIAGNOSIS — I25110 Atherosclerotic heart disease of native coronary artery with unstable angina pectoris: Secondary | ICD-10-CM

## 2017-08-01 LAB — CBC
HCT: 46.3 % (ref 39.0–52.0)
Hemoglobin: 16.2 g/dL (ref 13.0–17.0)
MCH: 32.7 pg (ref 26.0–34.0)
MCHC: 35 g/dL (ref 30.0–36.0)
MCV: 93.3 fL (ref 78.0–100.0)
Platelets: 211 10*3/uL (ref 150–400)
RBC: 4.96 MIL/uL (ref 4.22–5.81)
RDW: 11.8 % (ref 11.5–15.5)
WBC: 7.4 10*3/uL (ref 4.0–10.5)

## 2017-08-01 LAB — BASIC METABOLIC PANEL
Anion gap: 12 (ref 5–15)
BUN: 13 mg/dL (ref 6–20)
CO2: 22 mmol/L (ref 22–32)
Calcium: 9.4 mg/dL (ref 8.9–10.3)
Chloride: 102 mmol/L (ref 101–111)
Creatinine, Ser: 1.28 mg/dL — ABNORMAL HIGH (ref 0.61–1.24)
GFR calc Af Amer: >60 mL/min (ref >60)
GFR calc non Af Amer: >60 mL/min (ref >60)
Glucose, Bld: 127 mg/dL — ABNORMAL HIGH (ref 65–99)
Potassium: 4.4 mmol/L (ref 3.5–5.1)
Sodium: 136 mmol/L (ref 135–145)

## 2017-08-01 LAB — URINALYSIS, ROUTINE W REFLEX MICROSCOPIC
Bilirubin Urine: NEGATIVE
Glucose, UA: NEGATIVE mg/dL
Hgb urine dipstick: NEGATIVE
Ketones, ur: NEGATIVE mg/dL
Leukocytes, UA: NEGATIVE
Nitrite: NEGATIVE
Protein, ur: NEGATIVE mg/dL
Specific Gravity, Urine: 1.009 (ref 1.005–1.030)
pH: 5 (ref 5.0–8.0)

## 2017-08-01 LAB — CBG MONITORING, ED: Glucose-Capillary: 123 mg/dL — ABNORMAL HIGH (ref 65–99)

## 2017-08-01 LAB — TROPONIN I: Troponin I: <0.03 ng/mL (ref <0.03)

## 2017-08-01 MED ORDER — ATORVASTATIN CALCIUM 80 MG PO TABS
80.0000 mg | ORAL_TABLET | Freq: Every day | ORAL | Status: DC
  Administered 2017-08-02: 80 mg via ORAL
  Filled 2017-08-01: qty 1

## 2017-08-01 MED ORDER — ISOSORBIDE MONONITRATE ER 30 MG PO TB24
30.0000 mg | ORAL_TABLET | Freq: Two times a day (BID) | ORAL | Status: DC
  Administered 2017-08-01 – 2017-08-03 (×4): 30 mg via ORAL
  Filled 2017-08-01 (×4): qty 1

## 2017-08-01 MED ORDER — ENOXAPARIN SODIUM 40 MG/0.4ML ~~LOC~~ SOLN
40.0000 mg | SUBCUTANEOUS | Status: DC
  Administered 2017-08-02: 40 mg via SUBCUTANEOUS
  Filled 2017-08-01 (×2): qty 0.4

## 2017-08-01 MED ORDER — ASPIRIN 81 MG PO CHEW
81.0000 mg | CHEWABLE_TABLET | Freq: Every day | ORAL | Status: DC
  Administered 2017-08-02 – 2017-08-03 (×2): 81 mg via ORAL
  Filled 2017-08-01 (×2): qty 1

## 2017-08-01 MED ORDER — CARVEDILOL 3.125 MG PO TABS
3.1250 mg | ORAL_TABLET | Freq: Two times a day (BID) | ORAL | Status: DC
  Administered 2017-08-02 – 2017-08-03 (×3): 3.125 mg via ORAL
  Filled 2017-08-01 (×4): qty 1

## 2017-08-01 MED ORDER — ACETAMINOPHEN 650 MG RE SUPP: 650.0000 mg | Freq: Four times a day (QID) | RECTAL | Status: DC | PRN

## 2017-08-01 MED ORDER — ACETAMINOPHEN 325 MG PO TABS
650.0000 mg | ORAL_TABLET | Freq: Four times a day (QID) | ORAL | Status: DC | PRN
  Filled 2017-08-01: qty 2

## 2017-08-01 NOTE — ED Notes (Signed)
Pt returned from xray

## 2017-08-01 NOTE — ED Triage Notes (Signed)
Pt presents with feeling lightheaded that began today.  Pt is scheduled for CABG on 2/14.  Pt denies any chest pain or increased shortness of breath.

## 2017-08-01 NOTE — ED Provider Notes (Signed)
[facility] EMERGENCY DEPARTMENT Provider Note   CSN: [id number] Arrival date & time: 08/01/17  1352     History   Chief Complaint No chief complaint on file.   HPI Adrian Graham is a 47 y.o. male.  HPI   46yM with CP. He has known severe multivessel coronary artery disease.  He is actually scheduled for CABG on 2/14.  Today he was sitting on his couch and began experiencing chest pain.  He states that he was reposition himself on the couch and the pain began.  He thinks he may have strained his chest doing this.  He has noticed the pain is worse with movement.  Is also different in character and the chest pain he has been having more recently.  Denies any associated symptoms such as dyspnea, nausea or diaphoresis.  He understandably felt he should be evaluated though with his significant underlying disease.   Past Medical History:  Diagnosis Date  . Acute myocardial infarction (HCC) 04/25/2017  . Acute ST elevation myocardial infarction (STEMI) of inferior wall (HCC) 04/25/2017  . DM (diabetes mellitus) (HCC)   . Family history of early CAD 04/25/2017  . Hyperlipidemia LDL goal <70 04/25/2017    Patient Active Problem List   Diagnosis Date Noted  . Accelerating angina (HCC)   . CAD (coronary artery disease), native coronary artery 06/09/2017  . Acute ST elevation myocardial infarction (STEMI) of inferior wall (HCC) 04/25/2017  . Hyperlipidemia LDL goal <70 04/25/2017  . Family history of early CAD 04/25/2017  . Acute myocardial infarction [facility]) 04/25/2017    Past Surgical History:  Procedure Laterality Date  . CORONARY/GRAFT ACUTE MI REVASCULARIZATION N/A 04/25/2017   Procedure: Coronary/Graft Acute MI Revascularization;  Surgeon: Lyn Records, MD;  Location: [facility] INVASIVE CV LAB;  Service: Cardiovascular;  Laterality: N/A;  . LEFT HEART CATH AND CORONARY ANGIOGRAPHY N/A 04/25/2017   Procedure: LEFT HEART CATH AND CORONARY ANGIOGRAPHY;  Surgeon: Lyn Records, MD;  Location: [facility] INVASIVE CV LAB;  Service: Cardiovascular;  Laterality: N/A;  . LEFT HEART CATH AND CORONARY ANGIOGRAPHY N/A 07/28/2017   Procedure: LEFT HEART CATH AND CORONARY ANGIOGRAPHY;  Surgeon: Lyn Records, MD;  Location: [facility] INVASIVE CV LAB;  Service: Cardiovascular;  Laterality: N/A;       Home Medications    Prior to Admission medications   Medication Sig Start Date End Date Taking? Authorizing Provider  aspirin 81 MG chewable tablet Chew 1 tablet (81 mg total) daily by mouth. 04/28/17   Bhagat, Sharrell Ku, PA  atorvastatin (LIPITOR) 80 MG tablet Take 1 tablet (80 mg total) daily at 6 PM by mouth. 04/27/17   Manson Passey, PA  carvedilol (COREG) 3.125 MG tablet Take 1 tablet (3.125 mg total) by mouth 2 (two) times daily with a meal. 05/12/17   Rosalio Macadamia, NP  isosorbide mononitrate (IMDUR) 30 MG 24 hr tablet Take 2 tablets (60 mg total) by mouth daily. 07/29/17 10/27/17  Lyn Records, MD  nitroGLYCERIN (NITROSTAT) 0.4 MG SL tablet Place 1 tablet (0.4 mg total) every 5 (five) minutes as needed under the tongue for chest pain. 04/27/17   Manson Passey, PA  ticagrelor (BRILINTA) 90 MG TABS tablet Take 1 tablet (90 mg total) 2 (two) times daily by mouth. 04/27/17   Manson Passey, PA    Family History Family History  Problem Relation Age of Onset  . CAD Mother 52       cabg 10  . Heart attack Father 33  1980  . Cancer Brother   . Heart attack Brother        deceased 11/10/2014  . Heart attack Brother        11/09/00  . CAD Brother     Social History Social History   Tobacco Use  . Smoking status: Former Smoker    Types: E-cigarettes    Last attempt to quit: 05/02/2017    Years since quitting: 0.2  . Smokeless tobacco: Former Neurosurgeon    Quit date: 05/02/2017  Substance Use Topics  . Alcohol use: Yes    Comment: Rare  . Drug use: No     Allergies   Patient has no known allergies.   Review of Systems Review of Systems  All systems reviewed  and negative, other than as noted in HPI.  Physical Exam Updated Vital Signs BP (!) 140/98 (BP Location: Right Arm)   Pulse 77   Temp 98.4 F (36.9 C) (Oral)   Resp 16   SpO2 100%   Physical Exam  Constitutional: He appears well-developed and well-nourished. No distress.  HENT:  Head: Normocephalic and atraumatic.  Eyes: Conjunctivae are normal. Right eye exhibits no discharge. Left eye exhibits no discharge.  Neck: Neck supple.  Cardiovascular: Normal rate, regular rhythm and normal heart sounds. Exam reveals no gallop and no friction rub.  No murmur heard. Pulmonary/Chest: Effort normal and breath sounds normal. No respiratory distress.  Abdominal: Soft. He exhibits no distension. There is no tenderness.  Musculoskeletal: He exhibits no edema or tenderness.  Neurological: He is alert.  Skin: Skin is warm and dry.  Psychiatric: He has a normal mood and affect. His behavior is normal. Thought content normal.  Nursing note and vitals reviewed.    ED Treatments / Results  Labs (all labs ordered are listed, but only abnormal results are displayed) Labs Reviewed  BASIC METABOLIC PANEL - Abnormal; Notable for the following components:      Result Value   Glucose, Bld 127 (*)    Creatinine, Ser 1.28 (*)    All other components within normal limits  CBC  URINALYSIS, ROUTINE W REFLEX MICROSCOPIC  TROPONIN I  CBG MONITORING, ED    EKG  EKG Interpretation  Date/Time:  Saturday August 01 2017 14:01:27 EST Ventricular Rate:  76 PR Interval:  132 QRS Duration: 84 QT Interval:  380 QTC Calculation: 427 R Axis:   68 Text Interpretation:  Normal sinus rhythm Non-specific ST-t changes Confirmed by Raeford Razor [phone]) on 08/01/2017 4:01:09 PM       Radiology No results found.  Procedures Procedures (including critical care time)  Medications Ordered in ED Medications - No data to display   Initial Impression / Assessment and Plan / ED Course  I have reviewed the  triage vital signs and the nursing notes.  Pertinent labs & imaging results that were available during my care of the patient were reviewed by me and considered in my medical decision making (see chart for details).     47 year old male with chest pain.  It sounds like this may potentially be musculoskeletal although he does have severe multivessel coronary artery disease.  He is scheduled for CABG in a few days.  Will discuss with cardiology in terms of recommendations.  5:37 PM Discussed with cardiology, Dr. Virgina Organ.  With atypical symptoms she feels that her troponin is more normal than he can be admitted to the medical service.  If it is positive cardiology will admit.  Final Clinical Impressions(s) /  ED Diagnoses   Final diagnoses:  Chest pain, unspecified type    ED Discharge Orders    None       Raeford Razor, MD 08/01/17 1750

## 2017-08-01 NOTE — H&P (Signed)
TRH H&P   Patient Demographics:    Adrian Graham, is a 47 y.o. male  MRN: [id number]   DOB - [birth year]  Admit Date - 08/01/2017  Outpatient Primary MD for the patient is Buckner Malta, MD  Referring MD/NP/PA:  Raeford Razor  Outpatient Specialists:   [facility] Casey  Patient coming from:  home  No chief complaint on file.  chest pain   HPI:    Adrian Graham  is a 47 y.o. male, w hx of Dm2, Hyperlipidemia,  STEMI 04/25/2017 apparently was lying on the couch and turned over and experienced left sided chest pain , sharp,pt thought might have pulled a muscle but wanted to come into get checked out.  Pt denies fever, chills, cough, palp, sob, n/v, diarrhea, brbpr.   Pt presented to ED due to pain  In Ed,  CXR pending  EKG nsr at 75, nl axis, early R progression   Na 136, K 4.4, Bun13, Creatinine 1.28 Wbc 7.4, Hgb 16.2, Plt 211 Urine negative Trop <0.03    Pt is awaiting CABG  08/06/2017      Review of systems:    In addition to the HPI above,    No Fever-chills, No Headache, No changes with Vision or hearing, No problems swallowing food or Liquids, No  Cough or Shortness of Breath, No Abdominal pain, No Nausea or Vommitting, Bowel movements are regular, No Blood in stool or Urine, No dysuria, No new skin rashes or bruises, No new joints pains-aches,  No new weakness, tingling, numbness in any extremity, No recent weight gain or loss, No polyuria, polydypsia or polyphagia, No significant Mental Stressors.  A full 10 point Review of Systems was done, except as stated above, all other Review of Systems were negative.   With Past History of the following :    Past Medical History:  Diagnosis Date  . Acute myocardial infarction (HCC) 04/25/2017  . Acute ST elevation myocardial infarction (STEMI) of inferior wall (HCC) 04/25/2017  . DM (diabetes  mellitus) (HCC)   . Family history of early CAD 04/25/2017  . Hyperlipidemia LDL goal <70 04/25/2017      Past Surgical History:  Procedure Laterality Date  . CORONARY/GRAFT ACUTE MI REVASCULARIZATION N/A 04/25/2017   Procedure: Coronary/Graft Acute MI Revascularization;  Surgeon: Lyn Records, MD;  Location: [facility] INVASIVE CV LAB;  Service: Cardiovascular;  Laterality: N/A;  . LEFT HEART CATH AND CORONARY ANGIOGRAPHY N/A 04/25/2017   Procedure: LEFT HEART CATH AND CORONARY ANGIOGRAPHY;  Surgeon: Lyn Records, MD;  Location: [facility] INVASIVE CV LAB;  Service: Cardiovascular;  Laterality: N/A;  . LEFT HEART CATH AND CORONARY ANGIOGRAPHY N/A 07/28/2017   Procedure: LEFT HEART CATH AND CORONARY ANGIOGRAPHY;  Surgeon: Lyn Records, MD;  Location: [facility] INVASIVE CV LAB;  Service: Cardiovascular;  Laterality: N/A;      Social History:  Social History   Tobacco Use  . Smoking status: Former Smoker    Types: E-cigarettes    Last attempt to quit: 05/02/2017    Years since quitting: 0.2  . Smokeless tobacco: Former Neurosurgeon    Quit date: 05/02/2017  Substance Use Topics  . Alcohol use: Yes    Comment: Rare     Lives - at home  Mobility - walks by self   Family History :     Family History  Problem Relation Age of Onset  . CAD Mother 57       cabg 28  . Heart attack Father 41       1980  . Cancer Brother   . Heart attack Brother        deceased 2014-11-19  . Heart attack Brother        11/18/00  . CAD Brother       Home Medications:   Prior to Admission medications   Medication Sig Start Date End Date Taking? Authorizing Provider  aspirin 81 MG chewable tablet Chew 1 tablet (81 mg total) daily by mouth. 04/28/17  Yes Bhagat, Bhavinkumar, PA  atorvastatin (LIPITOR) 80 MG tablet Take 1 tablet (80 mg total) daily at 6 PM by mouth. 04/27/17  Yes Bhagat, Bhavinkumar, PA  carvedilol (COREG) 3.125 MG tablet Take 1 tablet (3.125 mg total) by mouth 2 (two) times daily with a meal. 05/12/17  Yes  Rosalio Macadamia, NP  isosorbide mononitrate (IMDUR) 30 MG 24 hr tablet Take 2 tablets (60 mg total) by mouth daily. Patient taking differently: Take 30 mg by mouth 2 (two) times daily.  07/29/17 10/27/17 Yes Lyn Records, MD  nitroGLYCERIN (NITROSTAT) 0.4 MG SL tablet Place 1 tablet (0.4 mg total) every 5 (five) minutes as needed under the tongue for chest pain. 04/27/17  Yes Bhagat, Bhavinkumar, PA  ticagrelor (BRILINTA) 90 MG TABS tablet Take 1 tablet (90 mg total) 2 (two) times daily by mouth. 04/27/17   Manson Passey, PA     Allergies:    No Known Allergies   Physical Exam:   Vitals  Blood pressure (!) 117/95, pulse 79, temperature 98.4 F (36.9 C), temperature source Oral, resp. rate (!) 28, SpO2 97 %.   1. General  lying in bed in NAD,    2. Normal affect and insight, Not Suicidal or Homicidal, Awake Alert, Oriented X 3.  3. No F.N deficits, ALL C.Nerves Intact, Strength 5/5 all 4 extremities, Sensation intact all 4 extremities, Plantars down going.  4. Ears and Eyes appear Normal, Conjunctivae clear, PERRLA. Moist Oral Mucosa.  5. Supple Neck, No JVD, No cervical lymphadenopathy appriciated, No Carotid Bruits.  6. Symmetrical Chest wall movement, Good air movement bilaterally, CTAB.  7. RRR, No Gallops, Rubs or Murmurs, No Parasternal Heave.  8. Positive Bowel Sounds, Abdomen Soft, No tenderness, No organomegaly appriciated,No rebound -guarding or rigidity.  9.  No Cyanosis, Normal Skin Turgor, No Skin Rash or Bruise.  10. Good muscle tone,  joints appear normal , no effusions, Normal ROM.  11. No Palpable Lymph Nodes in Neck or Axillae  No chest pain with palpation   Data Review:    CBC Recent Labs  Lab 08/01/17 1407  WBC 7.4  HGB 16.2  HCT 46.3  PLT 211  MCV 93.3  MCH 32.7  MCHC 35.0  RDW 11.8   ------------------------------------------------------------------------------------------------------------------  Chemistries  Recent Labs  Lab  08/01/17 1407  NA 136  K 4.4  CL 102  CO2 22  GLUCOSE 127*  BUN 13  CREATININE 1.28*  CALCIUM 9.4   ------------------------------------------------------------------------------------------------------------------ estimated creatinine clearance is 81.5 mL/min (A) (by C-G formula based on SCr of 1.28 mg/dL (H)). ------------------------------------------------------------------------------------------------------------------ No results for input(s): TSH, T4TOTAL, T3FREE, THYROIDAB in the last 72 hours.  Invalid input(s): FREET3  Coagulation profile No results for input(s): INR, PROTIME in the last 168 hours. ------------------------------------------------------------------------------------------------------------------- No results for input(s): DDIMER in the last 72 hours. -------------------------------------------------------------------------------------------------------------------  Cardiac Enzymes Recent Labs  Lab 08/01/17 1654  TROPONINI <0.03   ------------------------------------------------------------------------------------------------------------------ No results found for: BNP   ---------------------------------------------------------------------------------------------------------------  Urinalysis    Component Value Date/Time   COLORURINE YELLOW 08/01/2017 1654   APPEARANCEUR CLEAR 08/01/2017 1654   LABSPEC 1.009 08/01/2017 1654   PHURINE 5.0 08/01/2017 1654   GLUCOSEU NEGATIVE 08/01/2017 1654   HGBUR NEGATIVE 08/01/2017 1654   BILIRUBINUR NEGATIVE 08/01/2017 1654   KETONESUR NEGATIVE 08/01/2017 1654   PROTEINUR NEGATIVE 08/01/2017 1654   NITRITE NEGATIVE 08/01/2017 1654   LEUKOCYTESUR NEGATIVE 08/01/2017 1654    ----------------------------------------------------------------------------------------------------------------   Imaging Results:    No results found.     Assessment & Plan:    Principal Problem:   Chest pain Active  Problems:   CAD (coronary artery disease), native coronary artery   Hyperglycemia    Chest pain Tele Trop I q6h x3 Check cardiac echo NPO after MN Cont aspirin carvedilol, lipitor Cardiology consult in AM , appreciate input  CAD Cont aspirin, carvedilol, lipitor HOLDING Brillinta due to CABG on 08/06/2017 By Tressie Stalker  Hyperglycemia Check hga1c   DVT Prophylaxis  Lovenox - SCDs  AM Labs Ordered, also please review Full Orders  Family Communication: Admission, patients condition and plan of care including tests being ordered have been discussed with the patient  who indicate understanding and agree with the plan and Code Status.  Code Status FULL CODE  Likely DC to  home  Condition GUARDED    Consults called: cardiology by [facility] Note ED contacted cardiology fellow who requested medical admission   Admission status: inpatient  Time spent in minutes : 45   Pearson Grippe M.D on 08/01/2017 at 9:54 PM  Between 7am to 7pm - Pager - [phone]  . After 7pm go to [email or web address] - password [email or web address]  [facility] - Office  [phone]

## 2017-08-02 ENCOUNTER — Encounter (HOSPITAL_COMMUNITY): Payer: Self-pay

## 2017-08-02 ENCOUNTER — Other Ambulatory Visit: Payer: Self-pay

## 2017-08-02 DIAGNOSIS — I2510 Atherosclerotic heart disease of native coronary artery without angina pectoris: Secondary | ICD-10-CM

## 2017-08-02 DIAGNOSIS — E785 Hyperlipidemia, unspecified: Secondary | ICD-10-CM

## 2017-08-02 DIAGNOSIS — E119 Type 2 diabetes mellitus without complications: Secondary | ICD-10-CM

## 2017-08-02 DIAGNOSIS — I208 Other forms of angina pectoris: Secondary | ICD-10-CM

## 2017-08-02 DIAGNOSIS — I252 Old myocardial infarction: Secondary | ICD-10-CM

## 2017-08-02 DIAGNOSIS — I25110 Atherosclerotic heart disease of native coronary artery with unstable angina pectoris: Secondary | ICD-10-CM

## 2017-08-02 LAB — COMPREHENSIVE METABOLIC PANEL
ALBUMIN: 3.7 g/dL (ref 3.5–5.0)
ALT: 39 U/L (ref 17–63)
ANION GAP: 11 (ref 5–15)
AST: 27 U/L (ref 15–41)
Alkaline Phosphatase: 72 U/L (ref 38–126)
BILIRUBIN TOTAL: 0.8 mg/dL (ref 0.3–1.2)
BUN: 16 mg/dL (ref 6–20)
CO2: 25 mmol/L (ref 22–32)
Calcium: 9.2 mg/dL (ref 8.9–10.3)
Chloride: 102 mmol/L (ref 101–111)
Creatinine, Ser: 1.42 mg/dL — ABNORMAL HIGH (ref 0.61–1.24)
GFR calc non Af Amer: 58 mL/min — ABNORMAL LOW (ref >60)
GLUCOSE: 127 mg/dL — AB (ref 65–99)
POTASSIUM: 4.1 mmol/L (ref 3.5–5.1)
SODIUM: 138 mmol/L (ref 135–145)
TOTAL PROTEIN: 6.2 g/dL — AB (ref 6.5–8.1)

## 2017-08-02 LAB — CBC
HEMATOCRIT: 42 % (ref 39.0–52.0)
HEMOGLOBIN: 14.5 g/dL (ref 13.0–17.0)
MCH: 32.4 pg (ref 26.0–34.0)
MCHC: 34.5 g/dL (ref 30.0–36.0)
MCV: 93.8 fL (ref 78.0–100.0)
Platelets: 166 10*3/uL (ref 150–400)
RBC: 4.48 MIL/uL (ref 4.22–5.81)
RDW: 11.9 % (ref 11.5–15.5)
WBC: 6.5 10*3/uL (ref 4.0–10.5)

## 2017-08-02 LAB — GLUCOSE, CAPILLARY
GLUCOSE-CAPILLARY: 182 mg/dL — AB (ref 65–99)
Glucose-Capillary: 112 mg/dL — ABNORMAL HIGH (ref 65–99)

## 2017-08-02 LAB — TROPONIN I
Troponin I: <0.03 ng/mL (ref <0.03)
Troponin I: <0.03 ng/mL (ref <0.03)

## 2017-08-02 LAB — HEMOGLOBIN A1C
HEMOGLOBIN A1C: 6.6 % — AB (ref 4.8–5.6)
MEAN PLASMA GLUCOSE: 142.72 mg/dL

## 2017-08-02 LAB — HIV ANTIBODY (ROUTINE TESTING W REFLEX): HIV Screen 4th Generation wRfx: NONREACTIVE

## 2017-08-02 MED ORDER — INSULIN ASPART 100 UNIT/ML ~~LOC~~ SOLN
0.0000 [IU] | Freq: Three times a day (TID) | SUBCUTANEOUS | Status: DC
  Administered 2017-08-02: 3 [IU] via SUBCUTANEOUS
  Administered 2017-08-03: 2 [IU] via SUBCUTANEOUS

## 2017-08-02 NOTE — Consult Note (Signed)
Cardiology Consultation:   Patient ID: Adrian Graham; [id number]; [birth year]   Admit date: 08/01/2017 Date of Consult: 08/02/2017  Primary Care Provider: Buckner Malta, MD Primary Cardiologist: Dr Katrinka Blazing   Patient Profile:   Adrian Graham is a 47 y.o. male with a hx of CAD who is being seen today for the evaluation of chest pain at the request of Dr Selena Batten.  History of Present Illness:   Adrian Graham is a single 47 y/o male, admitted 04/25/17 with an iferior lateral STEMI. At cath he had an occluded CFX with was treated with a DES. He had residual 70% dRCA and 85% pLAD. His EF was 50-55%. The plan was to further assess the patient as an OP with nuclear imaging and consider PCI vs off pump CABG. He saw Dr Katrinka Blazing in follow up Dec 19th and was doing well, no angina.   He was seen again in the office 07/24/17 with complaints of exertional chest pain and he was set up for OP cath. This was done 07/28/17 and revealed no ISR of the CFX stent, 50% dRCA, 95% ostial LAD, 75% Dx1, 60% mLAD with normal LVF. The pt was seen in consult by Dr Cornelius Moras 07/28/17. The plan was to hold Brilinta x 5 days then proceed with CABG. The pt's surgery is scheduled for 08/06/17, his last dose of Brilinta was 2/8 pm.   The pt was doing well until last PM. He says he sat up and had sudden Lt lower chest/ flank pain that radiated to his anterior chest, epigastric area. This was unlike his previous chest pain. He says he became anxious and came to the ED for further evaluation. He is pain free currently and his troponin are negative so far.   Past Medical History:  Diagnosis Date  . Acute myocardial infarction (HCC) 04/25/2017  . Acute ST elevation myocardial infarction (STEMI) of inferior wall (HCC) 04/25/2017  . DM (diabetes mellitus) (HCC)   . Family history of early CAD 04/25/2017  . Hyperlipidemia LDL goal <70 04/25/2017    Past Surgical History:  Procedure Laterality Date  . CORONARY/GRAFT ACUTE MI REVASCULARIZATION N/A  04/25/2017   Procedure: Coronary/Graft Acute MI Revascularization;  Surgeon: Lyn Records, MD;  Location: [facility] INVASIVE CV LAB;  Service: Cardiovascular;  Laterality: N/A;  . LEFT HEART CATH AND CORONARY ANGIOGRAPHY N/A 04/25/2017   Procedure: LEFT HEART CATH AND CORONARY ANGIOGRAPHY;  Surgeon: Lyn Records, MD;  Location: [facility] INVASIVE CV LAB;  Service: Cardiovascular;  Laterality: N/A;  . LEFT HEART CATH AND CORONARY ANGIOGRAPHY N/A 07/28/2017   Procedure: LEFT HEART CATH AND CORONARY ANGIOGRAPHY;  Surgeon: Lyn Records, MD;  Location: [facility] INVASIVE CV LAB;  Service: Cardiovascular;  Laterality: N/A;     Home Medications:  Prior to Admission medications   Medication Sig Start Date End Date Taking? Authorizing Provider  aspirin 81 MG chewable tablet Chew 1 tablet (81 mg total) daily by mouth. 04/28/17  Yes Bhagat, Bhavinkumar, PA  atorvastatin (LIPITOR) 80 MG tablet Take 1 tablet (80 mg total) daily at 6 PM by mouth. 04/27/17  Yes Bhagat, Bhavinkumar, PA  carvedilol (COREG) 3.125 MG tablet Take 1 tablet (3.125 mg total) by mouth 2 (two) times daily with a meal. 05/12/17  Yes Rosalio Macadamia, NP  isosorbide mononitrate (IMDUR) 30 MG 24 hr tablet Take 2 tablets (60 mg total) by mouth daily. Patient taking differently: Take 30 mg by mouth 2 (two) times daily.  07/29/17 10/27/17 Yes Lyn Records, MD  nitroGLYCERIN (  NITROSTAT) 0.4 MG SL tablet Place 1 tablet (0.4 mg total) every 5 (five) minutes as needed under the tongue for chest pain. 04/27/17  Yes Bhagat, Bhavinkumar, PA  ticagrelor (BRILINTA) 90 MG TABS tablet Take 1 tablet (90 mg total) 2 (two) times daily by mouth. 04/27/17   Manson Passey, PA    Inpatient Medications: Scheduled Meds: . aspirin  81 mg Oral Daily  . atorvastatin  80 mg Oral q1800  . carvedilol  3.125 mg Oral BID WC  . enoxaparin (LOVENOX) injection  40 mg Subcutaneous Q24H  . isosorbide mononitrate  30 mg Oral BID   Continuous Infusions:  PRN Meds: acetaminophen **OR**  acetaminophen  Allergies:   No Known Allergies  Social History:   Social History   Socioeconomic History  . Marital status: Single    Spouse name: Not on file  . Number of children: Not on file  . Years of education: Not on file  . Highest education level: Not on file  Social Needs  . Financial resource strain: Not on file  . Food insecurity - worry: Not on file  . Food insecurity - inability: Not on file  . Transportation needs - medical: Not on file  . Transportation needs - non-medical: Not on file  Occupational History  . Not on file  Tobacco Use  . Smoking status: Former Smoker    Types: E-cigarettes    Last attempt to quit: 05/02/2017    Years since quitting: 0.2  . Smokeless tobacco: Former Neurosurgeon    Quit date: 05/02/2017  Substance and Sexual Activity  . Alcohol use: Yes    Comment: Rare  . Drug use: No  . Sexual activity: Yes  Other Topics Concern  . Not on file  Social History Narrative  . Not on file    Family History:    Family History  Problem Relation Age of Onset  . CAD Mother 25       cabg 63  . Heart attack Father 63       1980  . Cancer Brother   . Heart attack Brother        deceased Nov 01, 2014  . Heart attack Brother        10-31-00  . CAD Brother      ROS:  Please see the history of present illness.  All other ROS reviewed and negative.     Physical Exam/Data:   Vitals:   08/02/17 0445 08/02/17 0545 08/02/17 0609 08/02/17 0811  BP: (!) 90/56   101/69  Pulse: 83 67 68 84  Resp: 18 12 13 20   Temp:    98.1 F (36.7 C)  TempSrc:    Oral  SpO2: 98% 97% 96% 99%   No intake or output data in the 24 hours ending 08/02/17 0949 There were no vitals filed for this visit. There is no height or weight on file to calculate BMI.  General:  Well nourished, well developed, in no acute distress HEENT: normal Lymph: no adenopathy Neck: no JVD Endocrine:  No thryomegaly Vascular: No carotid bruits; FA pulses 2+ bilaterally without bruits  Cardiac:   normal S1, S2; RRR; no murmur  Lungs:  clear to auscultation bilaterally, no wheezing, rhonchi or rales  Abd: soft, nontender, no hepatomegaly  Ext: no edema Musculoskeletal:  No deformities, BUE and BLE strength normal and equal Skin: warm and dry  Neuro:  CNs 2-12 intact, no focal abnormalities noted Psych:  Normal affect   EKG:  The EKG  was personally reviewed and demonstrates:  NSR  Relevant CV Studies: Cath 07/28/17-  Ostial to proximal 95% LAD stenosis showing evidence of progression since acute myocardial infarction in November 2018.  Widely patent left main  Widely patent circumflex stent placed during acute myocardial infarction in November 2018.  First obtuse marginal with 85% stenosis.  First obtuse marginal is jailed.  40-50% stenosis distal to stent.  Luminal irregularities in the mid and distal RCA.  Normal left ventricular systolic function.  Ejection fraction is 65%.  LVEDP is normal.   Laboratory Data:  Chemistry Recent Labs  Lab 08/01/17 1407 08/02/17 0440  NA 136 138  K 4.4 4.1  CL 102 102  CO2 22 25  GLUCOSE 127* 127*  BUN 13 16  CREATININE 1.28* 1.42*  CALCIUM 9.4 9.2  GFRNONAA >60 58*  GFRAA >60 >60  ANIONGAP 12 11    Recent Labs  Lab 08/02/17 0440  PROT 6.2*  ALBUMIN 3.7  AST 27  ALT 39  ALKPHOS 72  BILITOT 0.8   Hematology Recent Labs  Lab 08/01/17 1407 08/02/17 0440  WBC 7.4 6.5  RBC 4.96 4.48  HGB 16.2 14.5  HCT 46.3 42.0  MCV 93.3 93.8  MCH 32.7 32.4  MCHC 35.0 34.5  RDW 11.8 11.9  PLT 211 166   Cardiac Enzymes Recent Labs  Lab 08/01/17 1654 08/01/17 2319 08/02/17 0440  TROPONINI <0.03 <0.03 <0.03   No results for input(s): TROPIPOC in the last 168 hours.  BNPNo results for input(s): BNP, PROBNP in the last 168 hours.  DDimer No results for input(s): DDIMER in the last 168 hours.  Radiology/Studies:  Dg Chest 2 View  Result Date: 08/01/2017 CLINICAL DATA:  Pt presents with feeling lightheaded that began today.  Also complaining of left sided chest pain that began today. Pt is scheduled for CABG on 2/14. Hx of diabetes and acute MI in November 2018. Former smoker. EXAM: CHEST  2 VIEW COMPARISON:  None. FINDINGS: The heart size and mediastinal contours are within normal limits. Both lungs are clear. No pleural effusion or pneumothorax. The visualized skeletal structures are unremarkable. IMPRESSION: No active cardiopulmonary disease. Electronically Signed   By: Amie Portland M.D.   On: 08/01/2017 22:47    Assessment and Plan:   Chest pain- This episode sounds atypical for angina  Severe CAD- Ostial 95% LAD  History of STEMI- Inferior lateral STEMI Nov 2018-treated with CFX DES (patent 07/28/17)  Antiplatelet therapy- Brilinta on hold for CAGB- last dose 07/31/17  NIDDM- Hgb A1c was 8.3 in Nov 2018. The pt couldn't tolerate Metformin secondary to nausea, he is on diet now  Dyslipidemia- On high dose statin Rx  FM Hx - Father and 3 brothers with CAD   Plan: Even though his symptoms sound atypical, hesitant to send him home again with a 95% ostial LAD lesion. MD to review. Ostial and mid bifurcation LAD disease normal EF He thinks he Got anxious about waiting for CABG. He has r/o no ECG changes and no chest pain this am Will not start heparin and can likely be d/c in am to await CABG on 14 th consider giving him Short term xananx for anxiety. I did notify Dr Tyrone Sage that he was here and he also indicated that CABG is unlikely to be able to be move up. Exam normal no murmur , clear lungs no edema No carotid bruit good distal pulses    For questions or updates, please contact [facility] Please consult [email or web address]  for contact info under Cardiology/STEMI.   Jolene Provost, PA-C  08/02/2017 9:49 AM   Patient examined chart reviewed. Reviewed cath films from 07/28/17

## 2017-08-02 NOTE — ED Notes (Signed)
Attempted Report x1.   

## 2017-08-02 NOTE — Progress Notes (Signed)
Patient ID: Adrian Graham, male   DOB: [birth year], 47 y.o.   MRN: [id number]      [address]       [place] [address]             [phone]                     LOS: 1 day   Subjective:  Patient readmitted with known multivessel coronary artery disease status post acute ST segment elevation lateral wall myocardial infarction on April 25, 2017 treated with PCI and stenting of the left circumflex coronary artery using a drug eluting stent.  He was found to have  had moderate ostial stenosis of the left anterior descending coronary artery and mild nonobstructive disease in the right coronary artery which was treated medically.  Since then the patient has developed progressive symptoms of exertional angina. He had repeat cath FEB 5  and was found to have high-grade ostial stenosis of the left anterior descending coronary artery with continued patency of the stent in the left circumflex coronary artery. He has been maintained on Brilinta.  He stopped his Brilinta 2/8 for CABG by Dr Cornelius Moras 2/14    Noted left flank pain radiating to epigastrium last pm and was readmitted. EKG and troponin negative for MI.  Comfortable in hospital room this am  Objective: Vital signs in last 24 hours: Patient Vitals for the past 24 hrs:  BP Temp Temp src Pulse Resp SpO2  08/02/17 1054 103/68 99 F (37.2 C) Oral 64 13 98 %  08/02/17 0811 101/69 98.1 F (36.7 C) Oral 84 20 99 %  08/02/17 0609 - - - 68 13 96 %  08/02/17 0545 - - - 67 12 97 %  08/02/17 0445 (!) 90/56 - - 83 18 98 %  08/02/17 0245 - - - 79 16 97 %  08/02/17 0200 (!) 91/59 - - 74 17 97 %  08/02/17 0000 109/77 - - 71 14 98 %  08/01/17 2239 122/81 - - 76 20 99 %  08/01/17 2000 (!) 117/95 - - 79 (!) 28 97 %  08/01/17 1915 108/73 - - 71 13 98 %  08/01/17 1900 117/84 - - 64 13 98 %  08/01/17 1845 118/84 - - 80 13 99 %  08/01/17 1830 111/74 - - 69 17 99 %  08/01/17 1815 126/90 - - 66 16 99 %  08/01/17 1745 (!) 127/97 - - 80 17  100 %  08/01/17 1715 (!) 138/101 - - 66 13 100 %  08/01/17 1700 (!) 151/107 - - 66 15 100 %  08/01/17 1508 (!) 140/98 98.4 F (36.9 C) Oral 77 16 100 %  08/01/17 1402 (!) 162/103 98 F (36.7 C) - 77 16 100 %    There were no vitals filed for this visit.  Hemodynamic parameters for last 24 hours:    Intake/Output from previous day: No intake/output data recorded. Intake/Output this shift: No intake/output data recorded.  Scheduled Meds: . aspirin  81 mg Oral Daily  . atorvastatin  80 mg Oral q1800  . carvedilol  3.125 mg Oral BID WC  . enoxaparin (LOVENOX) injection  40 mg Subcutaneous Q24H  . isosorbide mononitrate  30 mg Oral BID   Continuous Infusions: PRN Meds:.acetaminophen **OR** acetaminophen  General appearance: alert and cooperative Neurologic: intact Heart: regular rate and rhythm, S1, S2 normal, no murmur, click, rub or gallop Lungs: clear to auscultation bilaterally Abdomen: soft, non-tender; bowel  sounds normal; no masses,  no organomegaly Extremities: extremities normal, atraumatic, no cyanosis or edema and Homans sign is negative, no sign of DVT  Lab Results: CBC: Recent Labs    08/01/17 1407 08/02/17 0440  WBC 7.4 6.5  HGB 16.2 14.5  HCT 46.3 42.0  PLT 211 166   BMET:  Recent Labs    08/01/17 1407 08/02/17 0440  NA 136 138  K 4.4 4.1  CL 102 102  CO2 22 25  GLUCOSE 127* 127*  BUN 13 16  CREATININE 1.28* 1.42*  CALCIUM 9.4 9.2    PT/INR: No results for input(s): LABPROT, INR in the last 72 hours.   Radiology Dg Chest 2 View  Result Date: 08/01/2017 CLINICAL DATA:  Pt presents with feeling lightheaded that began today. Also complaining of left sided chest pain that began today. Pt is scheduled for CABG on 2/14. Hx of diabetes and acute MI in November 2018. Former smoker. EXAM: CHEST  2 VIEW COMPARISON:  None. FINDINGS: The heart size and mediastinal contours are within normal limits. Both lungs are clear. No pleural effusion or  pneumothorax. The visualized skeletal structures are unremarkable. IMPRESSION: No active cardiopulmonary disease. Electronically Signed   By: Amie Portland M.D.   On: 08/01/2017 22:47     Assessment/Plan: Keep off Brilinta, start short acting anticoagulation as need Follow up P2Y12 testing in am and measure the extent of Brilinta effect Will notify Dr Cornelius Moras of readmission  Delight Ovens MD 08/02/2017 12:09 PM

## 2017-08-02 NOTE — ED Notes (Signed)
Patient was switched to a hospital bed and fed prior to midnight. Pt was informed he was NPO at midnight

## 2017-08-02 NOTE — Progress Notes (Signed)
PROGRESS NOTE [facility]   Phenix Vandermeulen   ZOX:[id number] DOB: 14-May-1971  DOA: 08/01/2017 PCP: Buckner Malta, MD   Brief Narrative:  Adrian Graham is a 47 year old male with medical history of diabetes type 2, hyperlipidemia, known multivessel coronary artery disease status post acute STEMI on 04/25/2017 treated with PCI and stenting of the left circumflex.  Is found to have moderate ostial stenosis of the left anterior descending coronary artery and mild nonobstructive disease of the right coronary artery which may be treated medically.  Repeat cath on 2/5 2019 shows high-grade ostial stenosis of the left anterior descending coronary artery.  Patient was recommended for CABG by Dr. Cornelius Moras.  He presented to the emergency department complaining of left flank pain radiating to the epigastrium and was admitted for further evaluation.  Troponin are negative for MI, EKG reassuring.  Of Brilinta since 07/31/2017. Cardiology and cardiac surgery was consulted recommended to monitor overnight and possible discharge in the morning.  Subjective: Patient seen and examined, currently asymptomatic.  He reported that he might get anxious about upcoming surgery.  No new concerns  Assessment & Plan: Chest pain - atypical Felt to be related to anxiety prior to upcoming procedure Currently asymptomatic Management per cardiology  Severe CAD/STEMI Ostial 95% LAD, he is a scheduled for CABG on 08/06/2017 EKG reassuring, troponin negative Management per cardiothoracic surgery Off Brilinta due to upcoming procedure  Diabetes mellitus type 2 A1c 8.3 in November 2018 Was prescribed metformin but could not tolerate due to nausea, currently on diet Will check A1c again, monitor CBGs and add sliding scale. Will consider oral hypoglycemic agent upon discharge  Hyperlipidemia Continue statin  DVT prophylaxis: Lovenox Code Status: Full code Family Communication: None at bedside Disposition Plan: Home  when cleared by CTTS and cardiology  Consultants:   Cardiology  CTTS  Procedures:   None  Antimicrobials:  None   Objective: Vitals:   08/02/17 0609 08/02/17 0811 08/02/17 0923 08/02/17 1054  BP:  101/69 108/82 103/68  Pulse: 68 84  64  Resp: 13 20  13   Temp:  98.1 F (36.7 C)  99 F (37.2 C)  TempSrc:  Oral  Oral  SpO2: 96% 99%  98%  Weight:    88 kg (194 lb)  Height:    5\' 9"  (1.753 m)    Intake/Output Summary (Last 24 hours) at 08/02/2017 1410 Last data filed at 08/02/2017 1030 Gross per 24 hour  Intake -  Output 800 ml  Net -800 ml   Filed Weights   08/02/17 1054  Weight: 88 kg (194 lb)    Examination:  General exam: Appears calm and comfortable  HEENT: AC/AT, PERRLA, OP moist and clear Respiratory system: Clear to auscultation. No wheezes,crackle or rhonchi Cardiovascular system: S1 & S2 heard, RRR. No JVD, murmurs, rubs or gallops Gastrointestinal system: Abdomen is nondistended, soft and nontender. No organomegaly or masses felt. Normal bowel sounds heard. Central nervous system: Alert and oriented. No focal neurological deficits. Extremities: No pedal edema. Symmetric, strength 5/5   Skin: No rashes, lesions or ulcers Psychiatry: Judgement and insight appear normal. Mood & affect appropriate.    Data Reviewed: I have personally reviewed following labs and imaging studies  CBC: Recent Labs  Lab 08/01/17 1407 08/02/17 0440  WBC 7.4 6.5  HGB 16.2 14.5  HCT 46.3 42.0  MCV 93.3 93.8  PLT 211 166   Basic Metabolic Panel: Recent Labs  Lab 08/01/17 1407 08/02/17 0440  NA 136 138  K 4.4 4.1  CL 102 102  CO2 22 25  GLUCOSE 127* 127*  BUN 13 16  CREATININE 1.28* 1.42*  CALCIUM 9.4 9.2   GFR: Estimated Creatinine Clearance: 72.1 mL/min (A) (by C-G formula based on SCr of 1.42 mg/dL (H)). Liver Function Tests: Recent Labs  Lab 08/02/17 0440  AST 27  ALT 39  ALKPHOS 72  BILITOT 0.8  PROT 6.2*  ALBUMIN 3.7   No results for  input(s): LIPASE, AMYLASE in the last 168 hours. No results for input(s): AMMONIA in the last 168 hours. Coagulation Profile: No results for input(s): INR, PROTIME in the last 168 hours. Cardiac Enzymes: Recent Labs  Lab 08/01/17 1654 08/01/17 2319 08/02/17 0440 08/02/17 1158  TROPONINI <0.03 <0.03 <0.03 <0.03   BNP (last 3 results) No results for input(s): PROBNP in the last 8760 hours. HbA1C: No results for input(s): HGBA1C in the last 72 hours. CBG: Recent Labs  Lab 07/28/17 0543 08/01/17 1700  GLUCAP 147* 123*   Lipid Profile: No results for input(s): CHOL, HDL, LDLCALC, TRIG, CHOLHDL, LDLDIRECT in the last 72 hours. Thyroid Function Tests: No results for input(s): TSH, T4TOTAL, FREET4, T3FREE, THYROIDAB in the last 72 hours. Anemia Panel: No results for input(s): VITAMINB12, FOLATE, FERRITIN, TIBC, IRON, RETICCTPCT in the last 72 hours. Sepsis Labs: No results for input(s): PROCALCITON, LATICACIDVEN in the last 168 hours.  No results found for this or any previous visit (from the past 240 hour(s)).    Radiology Studies: Dg Chest 2 View  Result Date: 08/01/2017 CLINICAL DATA:  Pt presents with feeling lightheaded that began today. Also complaining of left sided chest pain that began today. Pt is scheduled for CABG on 2/14. Hx of diabetes and acute MI in November 2018. Former smoker. EXAM: CHEST  2 VIEW COMPARISON:  None. FINDINGS: The heart size and mediastinal contours are within normal limits. Both lungs are clear. No pleural effusion or pneumothorax. The visualized skeletal structures are unremarkable. IMPRESSION: No active cardiopulmonary disease. Electronically Signed   By: Amie Portland M.D.   On: 08/01/2017 22:47      Scheduled Meds: . aspirin  81 mg Oral Daily  . atorvastatin  80 mg Oral q1800  . carvedilol  3.125 mg Oral BID WC  . enoxaparin (LOVENOX) injection  40 mg Subcutaneous Q24H  . isosorbide mononitrate  30 mg Oral BID   Continuous Infusions:    LOS: 1 day    Time spent: Total of 25 minutes spent with pt, greater than 50% of which was spent in discussion of  treatment, counseling and coordination of care   Latrelle Dodrill, MD Pager: Text Page via [email or web address]   If 7PM-7AM, please contact night-coverage [email or web address] 08/02/2017, 2:10 PM

## 2017-08-03 ENCOUNTER — Inpatient Hospital Stay (HOSPITAL_COMMUNITY): Payer: Self-pay

## 2017-08-03 DIAGNOSIS — R072 Precordial pain: Secondary | ICD-10-CM

## 2017-08-03 DIAGNOSIS — Z9861 Coronary angioplasty status: Secondary | ICD-10-CM

## 2017-08-03 LAB — BASIC METABOLIC PANEL
Anion gap: 11 (ref 5–15)
BUN: 17 mg/dL (ref 6–20)
CALCIUM: 9 mg/dL (ref 8.9–10.3)
CO2: 24 mmol/L (ref 22–32)
CREATININE: 1.18 mg/dL (ref 0.61–1.24)
Chloride: 102 mmol/L (ref 101–111)
GFR calc Af Amer: >60 mL/min (ref >60)
GLUCOSE: 127 mg/dL — AB (ref 65–99)
POTASSIUM: 3.8 mmol/L (ref 3.5–5.1)
SODIUM: 137 mmol/L (ref 135–145)

## 2017-08-03 LAB — GLUCOSE, CAPILLARY
GLUCOSE-CAPILLARY: 132 mg/dL — AB (ref 65–99)
Glucose-Capillary: 129 mg/dL — ABNORMAL HIGH (ref 65–99)

## 2017-08-03 LAB — PLATELET INHIBITION P2Y12: PLATELET FUNCTION P2Y12: 257 [PRU] (ref 194–418)

## 2017-08-03 NOTE — Care Management Note (Addendum)
Case Management Note Donn Pierini RN, BSN Unit 4E-Case Manager [phone]  Patient Details  Name: Adrian Graham MRN: [id number] Date of Birth: Sep 09, 1970  Subjective/Objective:  Pt admitted with chest pain, recent MI in Nov. 2018- with plan for CABG on 2/14- brilinta stopped on 07/31/17 for washout                  Action/Plan: PTA pt lived at home- independent- pt to d/c home and plan to return on 2/14 for planned CABG  Expected Discharge Date:  08/03/17               Expected Discharge Plan:  Home/Self Care  In-House Referral:  NA  Discharge planning Services  CM Consult, NA  Post Acute Care Choice:    Choice offered to:     DME Arranged:    DME Agency:     HH Arranged:    HH Agency:     Status of Service:  Completed, signed off  If discussed at Long Length of Stay Meetings, dates discussed:    Discharge Disposition: home/self care   Additional Comments:  Darrold Span, RN 08/03/2017, 12:17 PM

## 2017-08-03 NOTE — Progress Notes (Signed)
Progress Note  Patient Name: Adrian Graham Date of Encounter: 08/03/2017  Primary Cardiologist: Lesleigh Noe, MD   Subjective   No chest pain, he has not been up yet  Inpatient Medications    Scheduled Meds: . aspirin  81 mg Oral Daily  . atorvastatin  80 mg Oral q1800  . carvedilol  3.125 mg Oral BID WC  . enoxaparin (LOVENOX) injection  40 mg Subcutaneous Q24H  . insulin aspart  0-15 Units Subcutaneous TID WC  . isosorbide mononitrate  30 mg Oral BID   Continuous Infusions:  PRN Meds: acetaminophen **OR** acetaminophen   Vital Signs    Vitals:   08/02/17 1921 08/02/17 2030 08/02/17 2035 08/03/17 0418  BP: 99/67 112/75  100/68  Pulse: 75 70  66  Resp: 15 10  19   Temp: 99.3 F (37.4 C)  98 F (36.7 C) 97.8 F (36.6 C)  TempSrc: Oral  Oral Oral  SpO2: 98% 98%  99%  Weight:   195 lb 14.4 oz (88.9 kg)   Height:   5\' 9"  (1.753 m)    No intake or output data in the 24 hours ending 08/03/17 1145 Filed Weights   08/02/17 1054 08/02/17 2035  Weight: 194 lb (88 kg) 195 lb 14.4 oz (88.9 kg)    Telemetry    NSR - Personally Reviewed  ECG    NSR - Personally Reviewed  Physical Exam   GEN: No acute distress.   Neck: No JVD Cardiac: RRR, no murmurs, rubs, or gallops.  Respiratory: Clear to auscultation bilaterally. GI: Soft, nontender, non-distended  MS: No edema; No deformity. Neuro:  Nonfocal  Psych: Normal affect   Labs    Chemistry Recent Labs  Lab 08/01/17 1407 08/02/17 0440 08/03/17 0235  NA 136 138 137  K 4.4 4.1 3.8  CL 102 102 102  CO2 22 25 24   GLUCOSE 127* 127* 127*  BUN 13 16 17   CREATININE 1.28* 1.42* 1.18  CALCIUM 9.4 9.2 9.0  PROT  --  6.2*  --   ALBUMIN  --  3.7  --   AST  --  27  --   ALT  --  39  --   ALKPHOS  --  72  --   BILITOT  --  0.8  --   GFRNONAA >60 58* >60  GFRAA >60 >60 >60  ANIONGAP 12 11 11      Hematology Recent Labs  Lab 08/01/17 1407 08/02/17 0440  WBC 7.4 6.5  RBC 4.96 4.48  HGB 16.2 14.5    HCT 46.3 42.0  MCV 93.3 93.8  MCH 32.7 32.4  MCHC 35.0 34.5  RDW 11.8 11.9  PLT 211 166    Cardiac Enzymes Recent Labs  Lab 08/01/17 1654 08/01/17 2319 08/02/17 0440 08/02/17 1158  TROPONINI <0.03 <0.03 <0.03 <0.03   No results for input(s): TROPIPOC in the last 168 hours.   BNPNo results for input(s): BNP, PROBNP in the last 168 hours.   DDimer No results for input(s): DDIMER in the last 168 hours.   Radiology    Dg Chest 2 View  Result Date: 08/01/2017 CLINICAL DATA:  Pt presents with feeling lightheaded that began today. Also complaining of left sided chest pain that began today. Pt is scheduled for CABG on 2/14. Hx of diabetes and acute MI in November 2018. Former smoker. EXAM: CHEST  2 VIEW COMPARISON:  None. FINDINGS: The heart size and mediastinal contours are within normal limits. Both lungs are clear.  No pleural effusion or pneumothorax. The visualized skeletal structures are unremarkable. IMPRESSION: No active cardiopulmonary disease. Electronically Signed   By: Amie Portland M.D.   On: 08/01/2017 22:47    Cardiac Studies     Patient Profile     47 y.o. male s/p MI-PCI/DES to CFX Nov 2018. He had residual dRCA and pLAD disease to be treated medically with plans for OP Nuclear testing if he had symptoms. He was seen 07/24/17 with chest pain c/w angina and was re studied. This showed 95% ostial LAD, 60% mLAD, and 75% Dx1. The plan was to hold Brilinta x 5 days and have CABG on 08/06/17. The pt was admitted over the weekend 08/02/17 with atypical chest pain. MI has been ruled out.   Assessment & Plan    Chest pain- This episode sounds atypical for angina, Troponin negative x 3.   Severe CAD- Ostial 95% LAD  History of STEMI- Inferior lateral STEMI Nov 2018-treated with CFX DES (patent 07/28/17)  Antiplatelet therapy- Brilinta on hold for CAGB- last dose 07/31/17  NIDDM- Hgb A1c was 8.3 in Nov 2018. The pt couldn't tolerate Metformin secondary to nausea, he is  on diet now  Dyslipidemia- On high dose statin Rx  FM Hx - Father and 3 brothers with CAD   Plan: Ambulate, home later if he has been OOB without problems. CABG 08/06/17. MD to see.   For questions or updates, please contact [facility] Please consult [email or web address] for contact info under Cardiology/STEMI.      Signed, Corine Shelter, PA-C  08/03/2017, 11:45 AM    I have seen and examined the patient along with Corine Shelter, PA-C .  I have reviewed the chart, notes and new data.  I agree with PA's note.  Key new complaints: Most recent chest pain complaints sound musculoskeletal and have resolved Key examination changes: Normal cardiovascular exam Key new findings / data: Low risk acute ECG and biomarkers  PLAN: DC home, return as scheduled for bypass surgery on Thursday.  Thurmon Fair, MD, [facility] [phone] 08/03/2017, 2:02 PM

## 2017-08-03 NOTE — Discharge Summary (Signed)
Physician Discharge Summary  Adrian Graham  ZOX:[id number]  DOB: 09-15-1970  DOA: 08/01/2017 PCP: Buckner Malta, MD  Admit date: 08/01/2017 Discharge date: 08/03/2017  Admitted From: Home Disposition: Home  Recommendations for Outpatient Follow-up:  1. Follow up with PCP in 1-2 weeks 2. For CABG on 08/06/17  3. Please obtain BMP/CBC in one week to monitor Hgb and Cr   Discharge Condition: Stable CODE STATUS: Full code Diet recommendation: Heart Healthy   Brief/Interim Summary: For full details see H&P/Progress note, but in brief, Adrian Graham is a 47 year old male with medical history of diabetes type 2, hyperlipidemia, known multivessel coronary artery disease status post acute STEMI on 04/25/2017 treated with PCI and stenting of the left circumflex.  Is found to have moderate ostial stenosis of the left anterior descending coronary artery and mild nonobstructive disease of the right coronary artery which may be treated medically.  Repeat cath on 2/5 2019 shows high-grade ostial stenosis of the left anterior descending coronary artery.  Patient was recommended for CABG by Dr. Cornelius Moras.  He presented to the emergency department complaining of left flank pain radiating to the epigastrium and was admitted for further evaluation.  Troponin are negative for MI, EKG reassuring.  Of Brilinta since 07/31/2017. Cardiology and cardiac surgery was consulted recommended to monitor overnight.  Patient was observed on telemetry with no new changes.  He remains chest pain-free.  Cardiothoracic surgery was not able to move surgery for, so patient will be discharged and return to 08/06/17 for CABG. Patient deemed stable for discharge.   Subjective: Patient seen and examined, he denies chest pain, palpitations, shortness of breath and dizziness.  Tolerating diet well.  Discharge Diagnoses/Hospital Course:  Chest pain - atypical Felt to be related to anxiety prior to upcoming procedure Cardiology recommending  Xanax, not recommending this medication at this time as patient has improved without further interventions.  Benzos are not first-line for anxiety.  Currently asymptomatic  Severe CAD/STEMI Ostial 95% LAD, he is a scheduled for CABG on 08/06/2017 EKG reassuring, troponin negative Off Brilinta due to upcoming procedure CTTS unable to move procedure forward, advised to come to hospital on 08/06/2017 for scheduled procedure  Diabetes mellitus type 2 A1c 8.3 in November 2018 --> 6.6 08/02/2017 Was prescribed metformin but could not tolerate due to nausea, currently on diet We will not need oral hypoglycemic agent at this time, continue lifestyle modification with diet and exercise.  Hyperlipidemia Continue statin  AKI Prerenal Creatinine normal upon discharge  All other chronic medical condition were stable during the hospitalization.  On the day of the discharge the patient's vitals were stable, and no other acute medical condition were reported by patient. the patient was felt safe to be discharge to home  Discharge Instructions  You were cared for by a hospitalist during your hospital stay. If you have any questions about your discharge medications or the care you received while you were in the hospital after you are discharged, you can call the unit and asked to speak with the hospitalist on call if the hospitalist that took care of you is not available. Once you are discharged, your primary care physician will handle any further medical issues. Please note that NO REFILLS for any discharge medications will be authorized once you are discharged, as it is imperative that you return to your primary care physician (or establish a relationship with a primary care physician if you do not have one) for your aftercare needs so that they can reassess your  need for medications and monitor your lab values.  Discharge Instructions    Call MD for:  difficulty breathing, headache or visual  disturbances   Complete by:  As directed    Call MD for:  extreme fatigue   Complete by:  As directed    Call MD for:  hives   Complete by:  As directed    Call MD for:  persistant dizziness or light-headedness   Complete by:  As directed    Call MD for:  persistant nausea and vomiting   Complete by:  As directed    Call MD for:  redness, tenderness, or signs of infection (pain, swelling, redness, odor or green/yellow discharge around incision site)   Complete by:  As directed    Call MD for:  severe uncontrolled pain   Complete by:  As directed    Call MD for:  temperature >100.4   Complete by:  As directed    Diet - low sodium heart healthy   Complete by:  As directed    Increase activity slowly   Complete by:  As directed      Allergies as of 08/03/2017      Reactions   Metformin And Related Nausea Only      Medication List    STOP taking these medications   ticagrelor 90 MG Tabs tablet Commonly known as:  BRILINTA     TAKE these medications   aspirin 81 MG chewable tablet Chew 1 tablet (81 mg total) daily by mouth.   atorvastatin 80 MG tablet Commonly known as:  LIPITOR Take 1 tablet (80 mg total) daily at 6 PM by mouth.   carvedilol 3.125 MG tablet Commonly known as:  COREG Take 1 tablet (3.125 mg total) by mouth 2 (two) times daily with a meal.   isosorbide mononitrate 30 MG 24 hr tablet Commonly known as:  IMDUR Take 2 tablets (60 mg total) by mouth daily. What changed:    how much to take  when to take this   nitroGLYCERIN 0.4 MG SL tablet Commonly known as:  NITROSTAT Place 1 tablet (0.4 mg total) every 5 (five) minutes as needed under the tongue for chest pain.      Follow-up Information    Buckner Malta, MD. Schedule an appointment as soon as possible for a visit in 1 week(s).   Specialty:  Family Medicine Why:  Hospital follow-up Contact information: [address] [place] [address] [phone]        Lyn Records, MD  Follow up.   Specialty:  Cardiology Contact information: [address] [place] [address] [phone]          Allergies  Allergen Reactions  . Metformin And Related Nausea Only    Consultations:  Cardiology  CTTS   Procedures/Studies: Dg Chest 2 View  Result Date: 08/01/2017 CLINICAL DATA:  Pt presents with feeling lightheaded that began today. Also complaining of left sided chest pain that began today. Pt is scheduled for CABG on 2/14. Hx of diabetes and acute MI in November 2018. Former smoker. EXAM: CHEST  2 VIEW COMPARISON:  None. FINDINGS: The heart size and mediastinal contours are within normal limits. Both lungs are clear. No pleural effusion or pneumothorax. The visualized skeletal structures are unremarkable. IMPRESSION: No active cardiopulmonary disease. Electronically Signed   By: Amie Portland M.D.   On: 08/01/2017 22:47    Discharge Exam: Vitals:   08/02/17 2035 08/03/17 0418  BP:  100/68  Pulse:  66  Resp:  19  Temp: 98 F (36.7 C) 97.8 F (36.6 C)  SpO2:  99%   Vitals:   08/02/17 1921 08/02/17 2030 08/02/17 2035 08/03/17 0418  BP: 99/67 112/75  100/68  Pulse: 75 70  66  Resp: 15 10  19   Temp: 99.3 F (37.4 C)  98 F (36.7 C) 97.8 F (36.6 C)  TempSrc: Oral  Oral Oral  SpO2: 98% 98%  99%  Weight:   88.9 kg (195 lb 14.4 oz)   Height:   5\' 9"  (1.753 m)     General: Pt is alert, awake, not in acute distress Cardiovascular: RRR, S1/S2 +, no rubs, no gallops Respiratory: CTA bilaterally, no wheezing, no rhonchi Abdominal: Soft, NT, ND, bowel sounds + Extremities: no edema, no cyanosis  The results of significant diagnostics from this hospitalization (including imaging, microbiology, ancillary and laboratory) are listed below for reference.     Microbiology: No results found for this or any previous visit (from the past 240 hour(s)).   Labs: BNP (last 3 results) No results for input(s): BNP in the last 8760  hours. Basic Metabolic Panel: Recent Labs  Lab 08/01/17 1407 08/02/17 0440 08/03/17 0235  NA 136 138 137  K 4.4 4.1 3.8  CL 102 102 102  CO2 22 25 24   GLUCOSE 127* 127* 127*  BUN 13 16 17   CREATININE 1.28* 1.42* 1.18  CALCIUM 9.4 9.2 9.0   Liver Function Tests: Recent Labs  Lab 08/02/17 0440  AST 27  ALT 39  ALKPHOS 72  BILITOT 0.8  PROT 6.2*  ALBUMIN 3.7   No results for input(s): LIPASE, AMYLASE in the last 168 hours. No results for input(s): AMMONIA in the last 168 hours. CBC: Recent Labs  Lab 08/01/17 1407 08/02/17 0440  WBC 7.4 6.5  HGB 16.2 14.5  HCT 46.3 42.0  MCV 93.3 93.8  PLT 211 166   Cardiac Enzymes: Recent Labs  Lab 08/01/17 1654 08/01/17 2319 08/02/17 0440 08/02/17 1158  TROPONINI <0.03 <0.03 <0.03 <0.03   BNP: Invalid input(s): POCBNP CBG: Recent Labs  Lab 08/01/17 1700 08/02/17 1759 08/02/17 2144 08/03/17 0621 08/03/17 1113  GLUCAP 123* 182* 112* 129* 132*   D-Dimer No results for input(s): DDIMER in the last 72 hours. Hgb A1c Recent Labs    08/02/17 0440  HGBA1C 6.6*   Lipid Profile No results for input(s): CHOL, HDL, LDLCALC, TRIG, CHOLHDL, LDLDIRECT in the last 72 hours. Thyroid function studies No results for input(s): TSH, T4TOTAL, T3FREE, THYROIDAB in the last 72 hours.  Invalid input(s): FREET3 Anemia work up No results for input(s): VITAMINB12, FOLATE, FERRITIN, TIBC, IRON, RETICCTPCT in the last 72 hours. Urinalysis    Component Value Date/Time   COLORURINE YELLOW 08/01/2017 1654   APPEARANCEUR CLEAR 08/01/2017 1654   LABSPEC 1.009 08/01/2017 1654   PHURINE 5.0 08/01/2017 1654   GLUCOSEU NEGATIVE 08/01/2017 1654   HGBUR NEGATIVE 08/01/2017 1654   BILIRUBINUR NEGATIVE 08/01/2017 1654   KETONESUR NEGATIVE 08/01/2017 1654   PROTEINUR NEGATIVE 08/01/2017 1654   NITRITE NEGATIVE 08/01/2017 1654   LEUKOCYTESUR NEGATIVE 08/01/2017 1654   Sepsis Labs Invalid input(s): PROCALCITONIN,  WBC,   LACTICIDVEN Microbiology No results found for this or any previous visit (from the past 240 hour(s)).   Time coordinating discharge: 25 minutes  SIGNED:  Latrelle Dodrill, MD  [facility] 08/03/2017, 12:12 PM  Pager please text page via  [email or web address]

## 2017-08-03 NOTE — Pre-Procedure Instructions (Signed)
Adrian Graham  08/03/2017      [id number] - [place], [place] - [address] [place] [address] Phone: [phone] Fax: [phone]    Your procedure is scheduled on Thursday February 14.  Report to [facility] Admitting at 5:30 A.M.  Call this number if you have problems the morning of surgery:  [phone]   Remember:  Do not eat food or drink liquids after midnight.  Take these medicines the morning of surgery with A SIP OF WATER: carvedilol (coreg), isosorbide (Imdur)  7 days prior to surgery STOP taking any Aleve, Naproxen, Ibuprofen, Motrin, Advil, Goody's, BC's, all herbal medications, fish oil, and all vitamins  **Follow your surgeon's instructions on whether to stop taking Aspirin and Brilinta. If no instructions were given, please call surgeon's office**     How to Manage Your Diabetes Before and After Surgery  Why is it important to control my blood sugar before and after surgery? . Improving blood sugar levels before and after surgery helps healing and can limit problems. . A way of improving blood sugar control is eating a healthy diet by: o  Eating less sugar and carbohydrates o  Increasing activity/exercise o  Talking with your doctor about reaching your blood sugar goals . High blood sugars (greater than 180 mg/dL) can raise your risk of infections and slow your recovery, so you will need to focus on controlling your diabetes during the weeks before surgery. . Make sure that the doctor who takes care of your diabetes knows about your planned surgery including the date and location.  How do I manage my blood sugar before surgery? . Check your blood sugar at least 4 times a day, starting 2 days before surgery, to make sure that the level is not too high or low. o Check your blood sugar the morning of your surgery when you wake up and every 2 hours until you get to the Short Stay unit. . If your blood sugar is less  than 70 mg/dL, you will need to treat for low blood sugar: o Do not take insulin. o Treat a low blood sugar (less than 70 mg/dL) with  cup of clear juice (cranberry or apple), 4 glucose tablets, OR glucose gel. Recheck blood sugar in 15 minutes after treatment (to make sure it is greater than 70 mg/dL). If your blood sugar is not greater than 70 mg/dL on recheck, call [phone] o  for further instructions. . Report your blood sugar to the short stay nurse when you get to Short Stay.  . If you are admitted to the hospital after surgery: o Your blood sugar will be checked by the staff and you will probably be given insulin after surgery (instead of oral diabetes medicines) to make sure you have good blood sugar levels. o The goal for blood sugar control after surgery is 80-180 mg/dL.                Do not wear jewelry, make-up or nail polish.  Do not wear lotions, powders, or perfumes, or deodorant.  Do not shave 48 hours prior to surgery.  Men may shave face and neck.  Do not bring valuables to the hospital.  [facility] is not responsible for any belongings or valuables.  Contacts, dentures or bridgework may not be worn into surgery.  Leave your suitcase in the car.  After surgery it may be brought to your room.  For patients admitted  to the hospital, discharge time will be determined by your treatment team.  Patients discharged the day of surgery will not be allowed to drive home.   Special instructions:    [place]- Preparing For Surgery  Before surgery, you can play an important role. Because skin is not sterile, your skin needs to be as free of germs as possible. You can reduce the number of germs on your skin by washing with CHG (chlorahexidine gluconate) Soap before surgery.  CHG is an antiseptic cleaner which kills germs and bonds with the skin to continue killing germs even after washing.  Please do not use if you have an allergy to CHG or antibacterial soaps.  If your skin becomes reddened/irritated stop using the CHG.  Do not shave (including legs and underarms) for at least 48 hours prior to first CHG shower. It is OK to shave your face.  Please follow these instructions carefully.   1. Shower the NIGHT BEFORE SURGERY and the MORNING OF SURGERY with CHG.   2. If you chose to wash your hair, wash your hair first as usual with your normal shampoo.  3. After you shampoo, rinse your hair and body thoroughly to remove the shampoo.  4. Use CHG as you would any other liquid soap. You can apply CHG directly to the skin and wash gently with a scrungie or a clean washcloth.   5. Apply the CHG Soap to your body ONLY FROM THE NECK DOWN.  Do not use on open wounds or open sores. Avoid contact with your eyes, ears, mouth and genitals (private parts). Wash Face and genitals (private parts)  with your normal soap.  6. Wash thoroughly, paying special attention to the area where your surgery will be performed.  7. Thoroughly rinse your body with warm water from the neck down.  8. DO NOT shower/wash with your normal soap after using and rinsing off the CHG Soap.  9. Pat yourself dry with a CLEAN TOWEL.  10. Wear CLEAN PAJAMAS to bed the night before surgery, wear comfortable clothes the morning of surgery  11. Place CLEAN SHEETS on your bed the night of your first shower and DO NOT SLEEP WITH PETS.    Day of Surgery: Do not apply any deodorants/lotions. Please wear clean clothes to the hospital/surgery center.      Please read over the following fact sheets that you were given. Coughing and Deep Breathing, MRSA Information and Surgical Site Infection Prevention

## 2017-08-04 ENCOUNTER — Encounter (HOSPITAL_COMMUNITY): Payer: Self-pay

## 2017-08-04 ENCOUNTER — Ambulatory Visit (HOSPITAL_BASED_OUTPATIENT_CLINIC_OR_DEPARTMENT_OTHER)
Admit: 2017-08-04 | Discharge: 2017-08-04 | Disposition: A | Discharge status: Home / Self Care | Payer: Self-pay | Attending: Thoracic Surgery (Cardiothoracic Vascular Surgery) | Admitting: Thoracic Surgery (Cardiothoracic Vascular Surgery)

## 2017-08-04 ENCOUNTER — Ambulatory Visit (HOSPITAL_COMMUNITY)
Admit: 2017-08-04 | Discharge: 2017-08-04 | Disposition: A | Discharge status: Home / Self Care | Payer: Self-pay | Attending: Thoracic Surgery (Cardiothoracic Vascular Surgery) | Admitting: Thoracic Surgery (Cardiothoracic Vascular Surgery)

## 2017-08-04 ENCOUNTER — Other Ambulatory Visit: Payer: Self-pay

## 2017-08-04 ENCOUNTER — Encounter (HOSPITAL_COMMUNITY)
Admit: 2017-08-04 | Discharge: 2017-08-04 | Disposition: A | Discharge status: Home / Self Care | Payer: Self-pay | Attending: Thoracic Surgery (Cardiothoracic Vascular Surgery) | Admitting: Thoracic Surgery (Cardiothoracic Vascular Surgery)

## 2017-08-04 DIAGNOSIS — I10 Essential (primary) hypertension: Secondary | ICD-10-CM | POA: Insufficient documentation

## 2017-08-04 DIAGNOSIS — E785 Hyperlipidemia, unspecified: Secondary | ICD-10-CM | POA: Insufficient documentation

## 2017-08-04 DIAGNOSIS — Z87891 Personal history of nicotine dependence: Secondary | ICD-10-CM | POA: Insufficient documentation

## 2017-08-04 DIAGNOSIS — I2510 Atherosclerotic heart disease of native coronary artery without angina pectoris: Secondary | ICD-10-CM | POA: Insufficient documentation

## 2017-08-04 HISTORY — DX: Atherosclerotic heart disease of native coronary artery without angina pectoris: I25.10

## 2017-08-04 HISTORY — DX: Nasal congestion: R09.81

## 2017-08-04 LAB — PULMONARY FUNCTION TEST
DL/VA % PRED: 106 %
DL/VA: 4.96 ml/min/mmHg/L
DLCO UNC % PRED: 93 %
DLCO UNC: 30.26 ml/min/mmHg
DLCO cor % pred: 93 %
DLCO cor: 30.35 ml/min/mmHg
FEF 25-75 PRE: 5.35 L/s
FEF 25-75 Post: 5.35 L/sec
FEF2575-%Change-Post: 0 %
FEF2575-%Pred-Post: 145 %
FEF2575-%Pred-Pre: 145 %
FEV1-%CHANGE-POST: 0 %
FEV1-%PRED-POST: 96 %
FEV1-%PRED-PRE: 97 %
FEV1-POST: 3.92 L
FEV1-PRE: 3.95 L
FEV1FVC-%Change-Post: 0 %
FEV1FVC-%Pred-Pre: 109 %
FEV6-%CHANGE-POST: 0 %
FEV6-%PRED-POST: 90 %
FEV6-%Pred-Pre: 91 %
FEV6-POST: 4.54 L
FEV6-PRE: 4.55 L
FEV6FVC-%PRED-POST: 103 %
FEV6FVC-%PRED-PRE: 103 %
FVC-%Change-Post: 0 %
FVC-%Pred-Post: 88 %
FVC-%Pred-Pre: 88 %
FVC-Post: 4.54 L
FVC-Pre: 4.55 L
POST FEV6/FVC RATIO: 100 %
PRE FEV6/FVC RATIO: 100 %
Post FEV1/FVC ratio: 86 %
Pre FEV1/FVC ratio: 87 %
RV % PRED: 39 %
RV: 0.77 L
TLC % PRED: 77 %
TLC: 5.4 L

## 2017-08-04 LAB — BLOOD GAS, ARTERIAL
Acid-base deficit: 0.3 mmol/L (ref 0.0–2.0)
BICARBONATE: 23.6 mmol/L (ref 20.0–28.0)
Drawn by: 421801
FIO2: 21
O2 Saturation: 98.4 %
PH ART: 7.421 (ref 7.350–7.450)
Patient temperature: 98.6
pCO2 arterial: 36.9 mmHg (ref 32.0–48.0)
pO2, Arterial: 121 mmHg — ABNORMAL HIGH (ref 83.0–108.0)

## 2017-08-04 LAB — PROTIME-INR
INR: 1.08
Prothrombin Time: 13.9 seconds (ref 11.4–15.2)

## 2017-08-04 LAB — APTT: aPTT: 29 seconds (ref 24–36)

## 2017-08-04 LAB — GLUCOSE, CAPILLARY: Glucose-Capillary: 106 mg/dL — ABNORMAL HIGH (ref 65–99)

## 2017-08-04 LAB — SURGICAL PCR SCREEN
MRSA, PCR: NEGATIVE
Staphylococcus aureus: NEGATIVE

## 2017-08-04 LAB — ABO/RH: ABO/RH(D): A POS

## 2017-08-04 MED ORDER — ALBUTEROL SULFATE (2.5 MG/3ML) 0.083% IN NEBU
2.5000 mg | INHALATION_SOLUTION | Freq: Once | RESPIRATORY_TRACT | Status: AC
  Administered 2017-08-04: 2.5 mg via RESPIRATORY_TRACT

## 2017-08-04 NOTE — Progress Notes (Addendum)
Pre-op Cardiac Surgery  Carotid Findings:  No stenosis in bilateral carotid arteries.  Upper Extremity Right Left  Brachial Pressures 128/Triphasic 128/Triphasic  Radial Waveforms triphasic triphasic  Ulnar Waveforms triphasic triphasic  Palmar Arch (Allen's Test) WNL WNL    Lower  Extremity Right Left  Dorsalis Pedis 135/Triphasic 136/triphasic  Anterior Tibial    Posterior Tibial 136/triphasic 141/triphasic  Ankle/Brachial Indices 1.0 1.1    Findings: Bilateral ABI within normal limits. Marilynne Halsted, BS, RDMS, RVT

## 2017-08-04 NOTE — Progress Notes (Signed)
PCP - Dr. York Grice Cardiologist - Verdis Prime  Chest x-ray - 08/01/17 EKG - 08/02/17 ECHO - 08/01/17 Cardiac Cath - 07/28/17  Fasting Blood Sugar - pt states his PCP told him he did not need to check his CBG anymore or take his Diabetic medicine d/t weight loss. CBG today 106   Blood Thinner Instructions: Pt last Dose of brilinta 07/31/17, pt to keep taking Aspirin but to hold the day of surgery.   Anesthesia review: hx CAD in the past, recent hospital visit for chest pain. No symptoms at PAT appointment.   Patient denies shortness of breath, fever, cough and chest pain at PAT appointment   Patient verbalized understanding of instructions that were given to them at the PAT appointment. Patient was also instructed that they will need to review over the PAT instructions again at home before surgery.

## 2017-08-05 ENCOUNTER — Inpatient Hospital Stay (HOSPITAL_COMMUNITY)
Admission: EM | Admit: 2017-08-05 | Discharge: 2017-08-10 | DRG: 236 | Disposition: A | Discharge status: Home / Self Care | Payer: Self-pay | Attending: Thoracic Surgery (Cardiothoracic Vascular Surgery) | Admitting: Thoracic Surgery (Cardiothoracic Vascular Surgery)

## 2017-08-05 ENCOUNTER — Other Ambulatory Visit: Payer: Self-pay

## 2017-08-05 ENCOUNTER — Encounter (HOSPITAL_COMMUNITY): Payer: Self-pay | Admitting: Emergency Medicine

## 2017-08-05 ENCOUNTER — Emergency Department (HOSPITAL_COMMUNITY): Payer: Self-pay

## 2017-08-05 DIAGNOSIS — R072 Precordial pain: Secondary | ICD-10-CM

## 2017-08-05 DIAGNOSIS — J9811 Atelectasis: Secondary | ICD-10-CM | POA: Diagnosis not present

## 2017-08-05 DIAGNOSIS — Z8249 Family history of ischemic heart disease and other diseases of the circulatory system: Secondary | ICD-10-CM

## 2017-08-05 DIAGNOSIS — Z87891 Personal history of nicotine dependence: Secondary | ICD-10-CM

## 2017-08-05 DIAGNOSIS — I10 Essential (primary) hypertension: Secondary | ICD-10-CM | POA: Diagnosis present

## 2017-08-05 DIAGNOSIS — Z951 Presence of aortocoronary bypass graft: Secondary | ICD-10-CM

## 2017-08-05 DIAGNOSIS — E8770 Fluid overload, unspecified: Secondary | ICD-10-CM | POA: Diagnosis not present

## 2017-08-05 DIAGNOSIS — E119 Type 2 diabetes mellitus without complications: Secondary | ICD-10-CM | POA: Diagnosis present

## 2017-08-05 DIAGNOSIS — I2510 Atherosclerotic heart disease of native coronary artery without angina pectoris: Secondary | ICD-10-CM

## 2017-08-05 DIAGNOSIS — I25110 Atherosclerotic heart disease of native coronary artery with unstable angina pectoris: Principal | ICD-10-CM | POA: Diagnosis present

## 2017-08-05 DIAGNOSIS — Z7982 Long term (current) use of aspirin: Secondary | ICD-10-CM

## 2017-08-05 DIAGNOSIS — D696 Thrombocytopenia, unspecified: Secondary | ICD-10-CM | POA: Diagnosis not present

## 2017-08-05 DIAGNOSIS — I200 Unstable angina: Secondary | ICD-10-CM

## 2017-08-05 DIAGNOSIS — E785 Hyperlipidemia, unspecified: Secondary | ICD-10-CM | POA: Diagnosis present

## 2017-08-05 DIAGNOSIS — I252 Old myocardial infarction: Secondary | ICD-10-CM

## 2017-08-05 HISTORY — DX: Presence of aortocoronary bypass graft: Z95.1

## 2017-08-05 HISTORY — DX: Unspecified osteoarthritis, unspecified site: M19.90

## 2017-08-05 HISTORY — DX: Essential (primary) hypertension: I10

## 2017-08-05 HISTORY — DX: Prediabetes: R73.03

## 2017-08-05 LAB — BASIC METABOLIC PANEL
ANION GAP: 13 (ref 5–15)
BUN: 17 mg/dL (ref 6–20)
CALCIUM: 9.4 mg/dL (ref 8.9–10.3)
CO2: 24 mmol/L (ref 22–32)
CREATININE: 1.32 mg/dL — AB (ref 0.61–1.24)
Chloride: 100 mmol/L — ABNORMAL LOW (ref 101–111)
GFR calc Af Amer: >60 mL/min (ref >60)
GFR calc non Af Amer: >60 mL/min (ref >60)
GLUCOSE: 166 mg/dL — AB (ref 65–99)
Potassium: 3.8 mmol/L (ref 3.5–5.1)
Sodium: 137 mmol/L (ref 135–145)

## 2017-08-05 LAB — CBC
HCT: 43.6 % (ref 39.0–52.0)
HEMOGLOBIN: 15.3 g/dL (ref 13.0–17.0)
MCH: 32.6 pg (ref 26.0–34.0)
MCHC: 35.1 g/dL (ref 30.0–36.0)
MCV: 92.8 fL (ref 78.0–100.0)
Platelets: 173 10*3/uL (ref 150–400)
RBC: 4.7 MIL/uL (ref 4.22–5.81)
RDW: 11.7 % (ref 11.5–15.5)
WBC: 6.4 10*3/uL (ref 4.0–10.5)

## 2017-08-05 LAB — HEPARIN LEVEL (UNFRACTIONATED): Heparin Unfractionated: 0.61 IU/mL (ref 0.30–0.70)

## 2017-08-05 LAB — TYPE AND SCREEN
ABO/RH(D): A POS
ABO/RH(D): A POS
ANTIBODY SCREEN: NEGATIVE
ANTIBODY SCREEN: NEGATIVE

## 2017-08-05 LAB — I-STAT TROPONIN, ED: TROPONIN I, POC: 0.01 ng/mL (ref 0.00–0.08)

## 2017-08-05 MED ORDER — TRANEXAMIC ACID (OHS) BOLUS VIA INFUSION
15.0000 mg/kg | INTRAVENOUS | Status: DC
  Filled 2017-08-05 (×2): qty 1313

## 2017-08-05 MED ORDER — ATORVASTATIN CALCIUM 80 MG PO TABS
80.0000 mg | ORAL_TABLET | Freq: Every day | ORAL | Status: DC
  Administered 2017-08-07 – 2017-08-09 (×3): 80 mg via ORAL
  Filled 2017-08-05 (×3): qty 1

## 2017-08-05 MED ORDER — DOPAMINE-DEXTROSE 3.2-5 MG/ML-% IV SOLN
0.0000 ug/kg/min | INTRAVENOUS | Status: DC
  Filled 2017-08-05 (×2): qty 250

## 2017-08-05 MED ORDER — HEPARIN (PORCINE) IN NACL 100-0.45 UNIT/ML-% IJ SOLN
1100.0000 [IU]/h | INTRAMUSCULAR | Status: DC
  Administered 2017-08-05: 1100 [IU]/h via INTRAVENOUS
  Filled 2017-08-05: qty 250

## 2017-08-05 MED ORDER — TRANEXAMIC ACID (OHS) PUMP PRIME SOLUTION
2.0000 mg/kg | INTRAVENOUS | Status: DC
  Filled 2017-08-05 (×2): qty 1.75

## 2017-08-05 MED ORDER — ALPRAZOLAM 0.25 MG PO TABS: 0.2500 mg | ORAL_TABLET | Freq: Two times a day (BID) | ORAL | Status: DC | PRN

## 2017-08-05 MED ORDER — EPINEPHRINE PF 1 MG/ML IJ SOLN
0.0000 ug/min | INTRAVENOUS | Status: DC
  Filled 2017-08-05: qty 4

## 2017-08-05 MED ORDER — CHLORHEXIDINE GLUCONATE 0.12 % MT SOLN
15.0000 mL | Freq: Once | OROMUCOSAL | Status: AC
  Administered 2017-08-06: 15 mL via OROMUCOSAL
  Filled 2017-08-05: qty 15

## 2017-08-05 MED ORDER — NITROGLYCERIN 0.4 MG SL SUBL
0.4000 mg | SUBLINGUAL_TABLET | SUBLINGUAL | Status: DC | PRN
  Administered 2017-08-06 (×2): 0.4 mg via SUBLINGUAL
  Filled 2017-08-05: qty 1

## 2017-08-05 MED ORDER — CEFUROXIME SODIUM 1.5 G IV SOLR
1.5000 g | INTRAVENOUS | Status: AC
  Administered 2017-08-06: 1.5 g via INTRAVENOUS
  Filled 2017-08-05: qty 1.5

## 2017-08-05 MED ORDER — CARVEDILOL 3.125 MG PO TABS
3.1250 mg | ORAL_TABLET | Freq: Two times a day (BID) | ORAL | Status: DC
  Administered 2017-08-07 – 2017-08-10 (×7): 3.125 mg via ORAL
  Filled 2017-08-05 (×7): qty 1

## 2017-08-05 MED ORDER — BISACODYL 5 MG PO TBEC
5.0000 mg | DELAYED_RELEASE_TABLET | Freq: Once | ORAL | Status: AC
  Administered 2017-08-05: 5 mg via ORAL
  Filled 2017-08-05: qty 1

## 2017-08-05 MED ORDER — HEPARIN BOLUS VIA INFUSION
4000.0000 [IU] | Freq: Once | INTRAVENOUS | Status: AC
  Administered 2017-08-05: 4000 [IU] via INTRAVENOUS
  Filled 2017-08-05: qty 4000

## 2017-08-05 MED ORDER — SODIUM CHLORIDE 0.9 % IV SOLN
750.0000 mg | INTRAVENOUS | Status: DC
  Filled 2017-08-05: qty 750

## 2017-08-05 MED ORDER — VANCOMYCIN HCL 1000 MG IV SOLR
INTRAVENOUS | Status: AC
  Administered 2017-08-06: 1000 mL
  Filled 2017-08-05: qty 1000

## 2017-08-05 MED ORDER — METOPROLOL TARTRATE 12.5 MG HALF TABLET
12.5000 mg | ORAL_TABLET | Freq: Once | ORAL | Status: AC
  Administered 2017-08-06: 12.5 mg via ORAL
  Filled 2017-08-05: qty 1

## 2017-08-05 MED ORDER — PLASMA-LYTE 148 IV SOLN
INTRAVENOUS | Status: AC
  Administered 2017-08-06: 500 mL
  Filled 2017-08-05: qty 2.5

## 2017-08-05 MED ORDER — ASPIRIN 81 MG PO CHEW: 81.0000 mg | CHEWABLE_TABLET | Freq: Every day | ORAL | Status: DC

## 2017-08-05 MED ORDER — TRANEXAMIC ACID 1000 MG/10ML IV SOLN
1.5000 mg/kg/h | INTRAVENOUS | Status: DC
  Filled 2017-08-05: qty 25

## 2017-08-05 MED ORDER — ACETAMINOPHEN 325 MG PO TABS: 650.0000 mg | ORAL_TABLET | ORAL | Status: DC | PRN

## 2017-08-05 MED ORDER — NITROGLYCERIN IN D5W 200-5 MCG/ML-% IV SOLN
2.0000 ug/min | INTRAVENOUS | Status: DC
  Filled 2017-08-05 (×2): qty 250

## 2017-08-05 MED ORDER — VANCOMYCIN HCL 10 G IV SOLR
1500.0000 mg | INTRAVENOUS | Status: AC
  Administered 2017-08-06: 1500 mg via INTRAVENOUS
  Filled 2017-08-05: qty 1500

## 2017-08-05 MED ORDER — DEXMEDETOMIDINE HCL IN NACL 400 MCG/100ML IV SOLN
0.1000 ug/kg/h | INTRAVENOUS | Status: AC
  Administered 2017-08-06: .3 ug/kg/h via INTRAVENOUS
  Filled 2017-08-05: qty 100

## 2017-08-05 MED ORDER — MAGNESIUM SULFATE 50 % IJ SOLN
40.0000 meq | INTRAMUSCULAR | Status: DC
  Filled 2017-08-05 (×2): qty 9.85

## 2017-08-05 MED ORDER — CHLORHEXIDINE GLUCONATE 4 % EX LIQD
60.0000 mL | Freq: Once | CUTANEOUS | Status: AC
  Administered 2017-08-06: 4 via TOPICAL
  Filled 2017-08-05: qty 60

## 2017-08-05 MED ORDER — CHLORHEXIDINE GLUCONATE 4 % EX LIQD
60.0000 mL | Freq: Once | CUTANEOUS | Status: AC
  Administered 2017-08-05: 4 via TOPICAL
  Filled 2017-08-05: qty 60

## 2017-08-05 MED ORDER — POTASSIUM CHLORIDE 2 MEQ/ML IV SOLN
80.0000 meq | INTRAVENOUS | Status: DC
  Filled 2017-08-05: qty 40

## 2017-08-05 MED ORDER — SODIUM CHLORIDE 0.9 % IV SOLN
INTRAVENOUS | Status: AC
  Administered 2017-08-06: 1.4 [IU]/h via INTRAVENOUS
  Filled 2017-08-05: qty 1

## 2017-08-05 MED ORDER — TEMAZEPAM 7.5 MG PO CAPS: 15.0000 mg | ORAL_CAPSULE | Freq: Once | ORAL | Status: DC | PRN

## 2017-08-05 MED ORDER — SODIUM CHLORIDE 0.9 % IV SOLN
INTRAVENOUS | Status: DC
  Filled 2017-08-05: qty 30

## 2017-08-05 MED ORDER — SODIUM CHLORIDE 0.9 % IV SOLN
30.0000 ug/min | INTRAVENOUS | Status: AC
  Administered 2017-08-06: 25 ug/min via INTRAVENOUS
  Filled 2017-08-05: qty 2

## 2017-08-05 MED ORDER — ASPIRIN 81 MG PO CHEW
324.0000 mg | CHEWABLE_TABLET | Freq: Once | ORAL | Status: AC
Start: 2017-08-05 — End: 2017-08-05
  Administered 2017-08-05: 324 mg via ORAL
  Filled 2017-08-05: qty 4

## 2017-08-05 NOTE — Progress Notes (Signed)
Patient arrived from ED to 4E room 10. Telemetry monitor applied and CCMD notified.  Patient oriented to unit and room to include call light and phone.  Will continue to monitor.

## 2017-08-05 NOTE — Progress Notes (Signed)
ANTICOAGULATION CONSULT NOTE - Initial Consult  Pharmacy Consult for Heparin Indication: chest pain/ACS  Allergies  Allergen Reactions  . Metformin And Related Nausea Only    Patient Measurements: Height: 5' 9.5" (176.5 cm) Weight: 193 lb (87.5 kg) IBW/kg (Calculated) : 71.85 Heparin Dosing Weight: 87 kg  Vital Signs: Temp: 98.2 F (36.8 C) (02/13 2005) Temp Source: Oral (02/13 2005) BP: 110/81 (02/13 2005) Pulse Rate: 65 (02/13 2005)  Labs: Recent Labs    08/03/17 0235 08/04/17 1515 08/05/17 0632 08/05/17 1920  HGB  --   --  15.3  --   HCT  --   --  43.6  --   PLT  --   --  173  --   APTT  --  29  --   --   LABPROT  --  13.9  --   --   INR  --  1.08  --   --   HEPARINUNFRC  --   --   --  0.61  CREATININE 1.18  --  1.32*  --     Estimated Creatinine Clearance: 78.1 mL/min (A) (by C-G formula based on SCr of 1.32 mg/dL (H)).   Medical History: Past Medical History:  Diagnosis Date  . Acute myocardial infarction (HCC) 04/25/2017  . Acute ST elevation myocardial infarction (STEMI) of inferior wall (HCC) 04/25/2017  . Coronary artery disease   . DM (diabetes mellitus) (HCC)   . Family history of early CAD 04/25/2017  . Hyperlipidemia LDL goal <70 04/25/2017  . Nasal congestion     Medications:  Medications Prior to Admission  Medication Sig Dispense Refill Last Dose  . aspirin 81 MG chewable tablet Chew 1 tablet (81 mg total) daily by mouth.   08/04/2017 at Unknown time  . atorvastatin (LIPITOR) 80 MG tablet Take 1 tablet (80 mg total) daily at 6 PM by mouth. 30 tablet 6 08/04/2017 at Unknown time  . carvedilol (COREG) 3.125 MG tablet Take 1 tablet (3.125 mg total) by mouth 2 (two) times daily with a meal. 60 tablet 3 08/05/2017 at 0700  . isosorbide mononitrate (IMDUR) 30 MG 24 hr tablet Take 2 tablets (60 mg total) by mouth daily. (Patient taking differently: Take 30 mg by mouth 2 (two) times daily. ) 30 tablet 6 08/05/2017 at Unknown time  . nitroGLYCERIN  (NITROSTAT) 0.4 MG SL tablet Place 1 tablet (0.4 mg total) every 5 (five) minutes as needed under the tongue for chest pain. 25 tablet 12 08/05/2017 at unk    Assessment: 46 YOM here with substernal chest pain for CABG tomorrow. Pharmacy consulted to start IV heparin for ACS. H/H and Plt wnl. He is not on any anticoagulation at home.   Heparin level 0.61 therapeutic on 1100 units/hr. CABG in AM.  Goal of Therapy:  Heparin level 0.3-0.7 units/ml Monitor platelets by anticoagulation protocol: Yes   Plan:  Continue heparin at current rate Stop heparin at 0300 per plan for OHS  Bayard Hugger, PharmD, BCPS  Clinical Pharmacist  Pager: [phone]

## 2017-08-05 NOTE — Anesthesia Preprocedure Evaluation (Addendum)
Anesthesia Evaluation  Patient identified by MRN, date of birth, ID band Patient awake    Reviewed: Allergy & Precautions, NPO status , Patient's Chart, lab work & pertinent test results  History of Anesthesia Complications Negative for: history of anesthetic complications  Airway Mallampati: II  TM Distance: >3 FB Neck ROM: Full    Dental no notable dental hx. (+) Dental Advisory Given   Pulmonary former smoker,    Pulmonary exam normal        Cardiovascular hypertension, + angina + CAD, + Past MI and + Cardiac Stents  Normal cardiovascular exam  Conclusion    Ostial to proximal 95% LAD stenosis showing evidence of progression since acute myocardial infarction in November 2018.  Widely patent left main  Widely patent circumflex stent placed during acute myocardial infarction in November 2018. First obtuse marginal with 85% stenosis. First obtuse marginal is jailed. 40-50% stenosis distal to stent.  Luminal irregularities in the mid and distal RCA.  Normal left ventricular systolic function. Ejection fraction is 65%. LVEDP is normal.     Neuro/Psych negative neurological ROS  negative psych ROS   GI/Hepatic negative GI ROS, Neg liver ROS,   Endo/Other  diabetes  Renal/GU negative Renal ROS     Musculoskeletal negative musculoskeletal ROS (+)   Abdominal   Peds  Hematology negative hematology ROS (+)   Anesthesia Other Findings Day of surgery medications reviewed with the patient.  Reproductive/Obstetrics                            Anesthesia Physical Anesthesia Plan  ASA: IV  Anesthesia Plan: General   Post-op Pain Management:    Induction: Intravenous  PONV Risk Score and Plan: 2 and Ondansetron, Dexamethasone and Treatment may vary due to age or medical condition  Airway Management Planned: Oral ETT  Additional Equipment: Arterial line, PA Cath, 3D TEE and  Ultrasound Guidance Line Placement  Intra-op Plan:   Post-operative Plan: Post-operative intubation/ventilation  Informed Consent: I have reviewed the patients History and Physical, chart, labs and discussed the procedure including the risks, benefits and alternatives for the proposed anesthesia with the patient or authorized representative who has indicated his/her understanding and acceptance.   Dental advisory given  Plan Discussed with: CRNA and Anesthesiologist  Anesthesia Plan Comments:        Anesthesia Quick Evaluation

## 2017-08-05 NOTE — ED Provider Notes (Signed)
[facility] EMERGENCY DEPARTMENT Provider Note   CSN: [id number] Arrival date & time: 08/05/17  7829     History   Chief Complaint Chief Complaint  Patient presents with  . Chest Pain    HPI ANIRUDH BAIZ is a 47 y.o. male.  Patient with known coronary artery disease.  Patient had onset of chest pain this morning had some yesterday but this morning it was more intense it was exertional and also at rest patient took nitroglycerin and it resolved.      Past Medical History:  Diagnosis Date  . Acute myocardial infarction (HCC) 04/25/2017  . Acute ST elevation myocardial infarction (STEMI) of inferior wall (HCC) 04/25/2017  . Coronary artery disease   . DM (diabetes mellitus) (HCC)   . Family history of early CAD 04/25/2017  . Hyperlipidemia LDL goal <70 04/25/2017  . Nasal congestion     Patient Active Problem List   Diagnosis Date Noted  . CAD-residual CAD post PCI 08/02/2017  . Non-insulin dependent type 2 diabetes mellitus (HCC) 08/02/2017  . Chest pain 08/01/2017  . Hyperglycemia 08/01/2017  . Accelerating angina (HCC)   . CAD -S/P PCI with DES Nov 2018 06/09/2017  . History of STEMI-Nov 2018 04/25/2017  . Hyperlipidemia LDL goal <70 04/25/2017  . Family history of early CAD 04/25/2017    Past Surgical History:  Procedure Laterality Date  . CARPAL TUNNEL RELEASE Left   . CORONARY/GRAFT ACUTE MI REVASCULARIZATION N/A 04/25/2017   Procedure: Coronary/Graft Acute MI Revascularization;  Surgeon: Lyn Records, MD;  Location: [facility] INVASIVE CV LAB;  Service: Cardiovascular;  Laterality: N/A;  . LEFT HEART CATH AND CORONARY ANGIOGRAPHY N/A 04/25/2017   Procedure: LEFT HEART CATH AND CORONARY ANGIOGRAPHY;  Surgeon: Lyn Records, MD;  Location: [facility] INVASIVE CV LAB;  Service: Cardiovascular;  Laterality: N/A;  . LEFT HEART CATH AND CORONARY ANGIOGRAPHY N/A 07/28/2017   Procedure: LEFT HEART CATH AND CORONARY ANGIOGRAPHY;  Surgeon: Lyn Records, MD;   Location: [facility] INVASIVE CV LAB;  Service: Cardiovascular;  Laterality: N/A;  . WISDOM TOOTH EXTRACTION         Home Medications    Prior to Admission medications   Medication Sig Start Date End Date Taking? Authorizing Provider  aspirin 81 MG chewable tablet Chew 1 tablet (81 mg total) daily by mouth. 04/28/17  Yes Bhagat, Bhavinkumar, PA  atorvastatin (LIPITOR) 80 MG tablet Take 1 tablet (80 mg total) daily at 6 PM by mouth. 04/27/17  Yes Bhagat, Bhavinkumar, PA  carvedilol (COREG) 3.125 MG tablet Take 1 tablet (3.125 mg total) by mouth 2 (two) times daily with a meal. 05/12/17  Yes Rosalio Macadamia, NP  isosorbide mononitrate (IMDUR) 30 MG 24 hr tablet Take 2 tablets (60 mg total) by mouth daily. Patient taking differently: Take 30 mg by mouth 2 (two) times daily.  07/29/17 10/27/17 Yes Lyn Records, MD  nitroGLYCERIN (NITROSTAT) 0.4 MG SL tablet Place 1 tablet (0.4 mg total) every 5 (five) minutes as needed under the tongue for chest pain. 04/27/17  Yes Bhagat, Sharrell Ku, PA    Family History Family History  Problem Relation Age of Onset  . CAD Mother 23       cabg 40  . Heart attack Father 33       1980  . Cancer Brother   . Heart attack Brother        deceased 11/16/14  . Heart attack Brother        11/15/00  .  CAD Brother     Social History Social History   Tobacco Use  . Smoking status: Former Smoker    Types: E-cigarettes    Last attempt to quit: 05/02/2017    Years since quitting: 0.2  . Smokeless tobacco: Former Neurosurgeon    Quit date: 05/02/2017  Substance Use Topics  . Alcohol use: No    Frequency: Never    Comment: Rare  . Drug use: No     Allergies   Metformin and related   Review of Systems Review of Systems  Constitutional: Negative for fever.  HENT: Negative for congestion.   Eyes: Negative for visual disturbance.  Respiratory: Negative for shortness of breath.   Cardiovascular: Positive for chest pain. Negative for leg swelling.  Gastrointestinal:  Negative for abdominal pain, nausea and vomiting.  Genitourinary: Negative for dysuria.  Musculoskeletal: Negative for back pain.  Skin: Negative for rash.  Hematological: Does not bruise/bleed easily.  Psychiatric/Behavioral: Negative for confusion.     Physical Exam Updated Vital Signs BP 116/80 (BP Location: Right Arm)   Pulse 60   Temp 98.3 F (36.8 C) (Oral)   Resp 19   Ht 1.765 m (5' 9.5")   Wt 87.5 kg (193 lb)   SpO2 98%   BMI 28.09 kg/m   Physical Exam  Constitutional: He is oriented to person, place, and time. He appears well-developed and well-nourished. He appears distressed.  HENT:  Head: Normocephalic and atraumatic.  Mouth/Throat: Oropharynx is clear and moist.  Eyes: Conjunctivae and EOM are normal. Pupils are equal, round, and reactive to light.  Neck: Normal range of motion. Neck supple.  Cardiovascular: Normal rate, regular rhythm and normal heart sounds.  Pulmonary/Chest: Effort normal and breath sounds normal.  Abdominal: Soft. Bowel sounds are normal.  Musculoskeletal: Normal range of motion. He exhibits no edema.  Neurological: He is alert and oriented to person, place, and time. No cranial nerve deficit or sensory deficit. He exhibits normal muscle tone. Coordination normal.  Skin: Skin is warm.  Nursing note and vitals reviewed.    ED Treatments / Results  Labs (all labs ordered are listed, but only abnormal results are displayed) Labs Reviewed  BASIC METABOLIC PANEL - Abnormal; Notable for the following components:      Result Value   Chloride 100 (*)    Glucose, Bld 166 (*)    Creatinine, Ser 1.32 (*)    All other components within normal limits  CBC  I-STAT TROPONIN, ED    EKG  EKG Interpretation None      ED ECG REPORT   Date: 08/05/2017  Rate: 64  Rhythm: normal sinus rhythm  QRS Axis: normal  Intervals: normal  ST/T Wave abnormalities: nonspecific ST changes  Conduction Disutrbances:none  Narrative Interpretation:    Old EKG Reviewed: none available  I have personally reviewed the EKG tracing and agree with the computerized printout as noted.   Radiology Dg Chest 2 View  Result Date: 08/05/2017 CLINICAL DATA:  Chest pain.  Pain for 5 days, worse today. EXAM: CHEST  2 VIEW COMPARISON:  Radiograph 08/01/2017 FINDINGS: The cardiomediastinal contours are normal. The lungs are clear. Pulmonary vasculature is normal. No consolidation, pleural effusion, or pneumothorax. No acute osseous abnormalities are seen. Again seen moderate dextroscoliotic curvature of the thoracic spine. IMPRESSION: No acute pulmonary process. Electronically Signed   By: Rubye Oaks M.D.   On: 08/05/2017 06:59    Procedures Procedures (including critical care time)  Medications Ordered in ED Medications  aspirin  chewable tablet 324 mg (not administered)     Initial Impression / Assessment and Plan / ED Course  I have reviewed the triage vital signs and the nursing notes.  Pertinent labs & imaging results that were available during my care of the patient were reviewed by me and considered in my medical decision making (see chart for details).    Patient currently chest pain-free.  Discussed with cardiology they will C.  Suspect he will be admitted since he is having open heart surgery tomorrow.  They concurred.  Workup here without any acute findings initial troponin was negative chest x-ray without evidence of pneumothorax pulmonary doom edema or pneumonia.  Patient currently chest pain-free.   Final Clinical Impressions(s) / ED Diagnoses   Final diagnoses:  Precordial pain    ED Discharge Orders    None       Vanetta Mulders, MD 08/05/17 1058

## 2017-08-05 NOTE — ED Triage Notes (Signed)
Pt c/o 7/10 bilateral cp for the past few days getting worse today, pt got one nitro sl by his own with some relief, no nausea, vomiting or SOB at this time, pt is a scheduled to have a bypass surgery on Thursday.

## 2017-08-05 NOTE — H&P (Signed)
Cardiology Admission History and Physical:   Patient ID: Adrian Graham; MRN: [id number]; DOB: 11-08-1970   Admission date: 08/05/2017  Primary Care Provider: Buckner Malta, MD Primary Cardiologist: Lesleigh Noe, MD  Chief Complaint:  Unstable Angina   Patient Profile:   47 y/o male, former smoker, with multivessel CAD with plans to undergo CABG tomorrow (sent home for Brilinta washout), presenting to the ED with unstable Angina. General cardiology has been asked to admit for observation and pain control, until pt can undergo scheduled CABG.     History of Present Illness:   47 y.o. male s/p MI-PCI/DES to CFX Nov 2018. He had residual dRCA and pLAD disease to be treated medically with plans for OP Nuclear testing if he had symptoms. He was seen 07/24/17 with chest pain c/w angina and was re studied. Cath showed 95% ostial LAD, 60% mLAD, and 75% Dx1. The plan was to hold Brilinta x 5 days and have pt return for CABG on 08/06/17. Pt actually presented back 08/01/17 with atypical CP. He was readmitted and cardiac enzymes were negative. He was discharged back home on 08/03/17. He now presents back again, a day before his surgery given recurrent CP c/w unstable angina. Similar to his angina prior to his MI. Substernal chest pressure occurring at rest and relieved with SL NTG. Cardiology has been asked to readmit.    EKG in the ED shows sinus rhythm, abnormal R-wave progression, early transition and minimal ST elevation, anterior leads. Stat POC troponin negative. VSS. Pt is currently CP free and denies any further recurrence since he took SL NTG at home. Is is a bit anxious about his surgery tomorrow.    Past Medical History:  Diagnosis Date  . Acute myocardial infarction (HCC) 04/25/2017  . Acute ST elevation myocardial infarction (STEMI) of inferior wall (HCC) 04/25/2017  . Coronary artery disease   . DM (diabetes mellitus) (HCC)   . Family history of early CAD 04/25/2017  . Hyperlipidemia  LDL goal <70 04/25/2017  . Nasal congestion     Past Surgical History:  Procedure Laterality Date  . CARPAL TUNNEL RELEASE Left   . CORONARY/GRAFT ACUTE MI REVASCULARIZATION N/A 04/25/2017   Procedure: Coronary/Graft Acute MI Revascularization;  Surgeon: Lyn Records, MD;  Location: [facility] INVASIVE CV LAB;  Service: Cardiovascular;  Laterality: N/A;  . LEFT HEART CATH AND CORONARY ANGIOGRAPHY N/A 04/25/2017   Procedure: LEFT HEART CATH AND CORONARY ANGIOGRAPHY;  Surgeon: Lyn Records, MD;  Location: [facility] INVASIVE CV LAB;  Service: Cardiovascular;  Laterality: N/A;  . LEFT HEART CATH AND CORONARY ANGIOGRAPHY N/A 07/28/2017   Procedure: LEFT HEART CATH AND CORONARY ANGIOGRAPHY;  Surgeon: Lyn Records, MD;  Location: [facility] INVASIVE CV LAB;  Service: Cardiovascular;  Laterality: N/A;  . WISDOM TOOTH EXTRACTION       Medications Prior to Admission: Prior to Admission medications   Medication Sig Start Date End Date Taking? Authorizing Provider  aspirin 81 MG chewable tablet Chew 1 tablet (81 mg total) daily by mouth. 04/28/17  Yes Bhagat, Bhavinkumar, PA  atorvastatin (LIPITOR) 80 MG tablet Take 1 tablet (80 mg total) daily at 6 PM by mouth. 04/27/17  Yes Bhagat, Bhavinkumar, PA  carvedilol (COREG) 3.125 MG tablet Take 1 tablet (3.125 mg total) by mouth 2 (two) times daily with a meal. 05/12/17  Yes Rosalio Macadamia, NP  isosorbide mononitrate (IMDUR) 30 MG 24 hr tablet Take 2 tablets (60 mg total) by mouth daily. Patient taking differently: Take 30  mg by mouth 2 (two) times daily.  07/29/17 10/27/17 Yes Lyn Records, MD  nitroGLYCERIN (NITROSTAT) 0.4 MG SL tablet Place 1 tablet (0.4 mg total) every 5 (five) minutes as needed under the tongue for chest pain. 04/27/17  Yes Bhagat, Sharrell Ku, PA     Allergies:    Allergies  Allergen Reactions  . Metformin And Related Nausea Only    Social History:   Social History   Socioeconomic History  . Marital status: Single    Spouse name: Not on file  .  Number of children: Not on file  . Years of education: Not on file  . Highest education level: Not on file  Social Needs  . Financial resource strain: Not on file  . Food insecurity - worry: Not on file  . Food insecurity - inability: Not on file  . Transportation needs - medical: Not on file  . Transportation needs - non-medical: Not on file  Occupational History  . Not on file  Tobacco Use  . Smoking status: Former Smoker    Types: E-cigarettes    Last attempt to quit: 05/02/2017    Years since quitting: 0.2  . Smokeless tobacco: Former Neurosurgeon    Quit date: 05/02/2017  Substance and Sexual Activity  . Alcohol use: No    Frequency: Never    Comment: Rare  . Drug use: No  . Sexual activity: Yes  Other Topics Concern  . Not on file  Social History Narrative  . Not on file    Family History:  The patient's family history includes CAD in his brother; CAD (age of onset: 68) in his mother; Cancer in his brother; Heart attack in his brother and brother; Heart attack (age of onset: 14) in his father.    ROS:  Please see the history of present illness.  All other ROS reviewed and negative.     Physical Exam/Data:   Vitals:   08/05/17 0801 08/05/17 0915 08/05/17 1000 08/05/17 1033  BP:   94/81 116/80  Pulse: 68 (!) 58 (!) 58 60  Resp: 16 15 16 19   Temp:      TempSrc:      SpO2: 95% 95% 95% 98%  Weight:      Height:       No intake or output data in the 24 hours ending 08/05/17 1033 Filed Weights   08/05/17 0634  Weight: 193 lb (87.5 kg)   Body mass index is 28.09 kg/m.  General:  Well nourished, well developed, in no acute distress HEENT: normal Lymph: no adenopathy Neck: no JVD Endocrine:  No thryomegaly Vascular: No carotid bruits; FA pulses 2+ bilaterally without bruits  Cardiac:  normal S1, S2; RRR; no murmur  Lungs:  clear to auscultation bilaterally, no wheezing, rhonchi or rales  Abd: soft, nontender, no hepatomegaly  Ext: no edema Musculoskeletal:  No  deformities, BUE and BLE strength normal and equal Skin: warm and dry  Neuro:  CNs 2-12 intact, no focal abnormalities noted Psych:  Normal affect    EKG:  The ECG that was done 08/05/17 was personally reviewed and demonstrates NSR   Relevant CV Studies: Conclusion    Ostial to proximal 95% LAD stenosis showing evidence of progression since acute myocardial infarction in November 2018.  Widely patent left main  Widely patent circumflex stent placed during acute myocardial infarction in November 2018.  First obtuse marginal with 85% stenosis.  First obtuse marginal is jailed.  40-50% stenosis distal to stent.  Luminal irregularities in the mid and distal RCA.  Normal left ventricular systolic function.  Ejection fraction is 65%.  LVEDP is normal.  RECOMMENDATIONS:   With ostial LAD and moderate mid vessel LAD disease involving a large diagonal, I believe surgical revascularization is the patient's best option with arterial conduit to the LAD.  This could perhaps be performed off pump.  The first obtuse marginal is not large enough to bypass.  The major issue now is antiplatelet therapy to avoid stent thrombosis in preparation for bypass surgery.  He is on Brilinta.  Will need to hold Brilinta at least 5 days.  We will get him set to see a surgeon within the next 48 hours to establish a game plan for moving forward.  With a second generation drug-eluting stent (everolimus), it is relatively safe to discontinue Brilinta for 5 days prior to surgery without an excessive risk of acute stent thrombosis.     Laboratory Data:  Chemistry Recent Labs  Lab 08/03/17 0235 08/05/17 0632  NA 137 137  K 3.8 3.8  CL 102 100*  CO2 24 24  GLUCOSE 127* 166*  BUN 17 17  CREATININE 1.18 1.32*  CALCIUM 9.0 9.4  GFRNONAA >60 >60  GFRAA >60 >60  ANIONGAP 11 13    Recent Labs  Lab 08/02/17 0440  PROT 6.2*  ALBUMIN 3.7  AST 27  ALT 39  ALKPHOS 72  BILITOT 0.8   Hematology Recent  Labs  Lab 08/02/17 0440 08/05/17 0632  WBC 6.5 6.4  RBC 4.48 4.70  HGB 14.5 15.3  HCT 42.0 43.6  MCV 93.8 92.8  MCH 32.4 32.6  MCHC 34.5 35.1  RDW 11.9 11.7  PLT 166 173   Cardiac Enzymes Recent Labs  Lab 08/01/17 2319 08/02/17 0440 08/02/17 1158  TROPONINI <0.03 <0.03 <0.03    Recent Labs  Lab 08/05/17 0703  TROPIPOC 0.01    BNPNo results for input(s): BNP, PROBNP in the last 168 hours.  DDimer No results for input(s): DDIMER in the last 168 hours.  Radiology/Studies:  Dg Chest 2 View  Result Date: 08/05/2017 CLINICAL DATA:  Chest pain.  Pain for 5 days, worse today. EXAM: CHEST  2 VIEW COMPARISON:  Radiograph 08/01/2017 FINDINGS: The cardiomediastinal contours are normal. The lungs are clear. Pulmonary vasculature is normal. No consolidation, pleural effusion, or pneumothorax. No acute osseous abnormalities are seen. Again seen moderate dextroscoliotic curvature of the thoracic spine. IMPRESSION: No acute pulmonary process. Electronically Signed   By: Rubye Oaks M.D.   On: 08/05/2017 06:59    Assessment and Plan:   47 y/o male, former smoker, with multivessel CAD with plans to undergo CABG tomorrow (was sent home for Brilinta washout) presenting to the ED with chest pain c/w unstable angina. General cardiology has been asked to admit for observation and pain control, until pt can undergo scheduled CABG.   1. CAD: scheduled for CABG tomorrow w/ Dr. Cornelius Moras. He has been off of Brilinta since 07/31/17. He had an episode of resting SSCP earlier this am, relieved with SLNTG. He is currently CP free. VSS.  EKG w/o acute changes. POC troponin in the ED negative. Will admit to general cardiology service for observation and pain control until he is able to go to the OR tomorrow.  Will admit to tele.  We will initiate IV heparin per pharmacy.  He has already taken his morning meds, including ASA, Coreg and Imdur. Give statin tonight. CT surgery to take over as primary tomorrow. Pt  is  also a bit anxious about sugery. Will order PRN Xanax.    For questions or updates, please contact [facility] Please consult [email or web address] for contact info under Cardiology/STEMI.    Signed, Robbie Lis, PA-C  08/05/2017 10:33 AM   Agree with note written by Boyce Medici  [facility]  Agree with note by Boyce Medici PAC. Patient well-known to [place] scheduled for coronary artery bypass grafting tomorrow. He stopped taking his Brilinta last Friday for planned washout. He has known CAD. He had chest pain this morning similar to his MI chest pain which has since resolved after taking sublingual glycerin. He is currently pain-free. His exam is benign. EKG shows no acute changes. His enzymes so far are negative. I agree with admission. We'll start IV heparin in anticipation for coronary artery bypass grafting tomorrow.   Nanetta Batty 08/05/2017 11:47 AM

## 2017-08-05 NOTE — Progress Notes (Signed)
ANTICOAGULATION CONSULT NOTE - Initial Consult  Pharmacy Consult for Heparin Indication: chest pain/ACS  Allergies  Allergen Reactions  . Metformin And Related Nausea Only    Patient Measurements: Height: 5' 9.5" (176.5 cm) Weight: 193 lb (87.5 kg) IBW/kg (Calculated) : 71.85 Heparin Dosing Weight: 87 kg  Vital Signs: Temp: 98.3 F (36.8 C) (02/13 0633) Temp Source: Oral (02/13 1245) BP: 104/77 (02/13 1130) Pulse Rate: 65 (02/13 1130)  Labs: Recent Labs    08/03/17 0235 08/04/17 1515 08/05/17 0632  HGB  --   --  15.3  HCT  --   --  43.6  PLT  --   --  173  APTT  --  29  --   LABPROT  --  13.9  --   INR  --  1.08  --   CREATININE 1.18  --  1.32*    Estimated Creatinine Clearance: 78.1 mL/min (A) (by C-G formula based on SCr of 1.32 mg/dL (H)).   Medical History: Past Medical History:  Diagnosis Date  . Acute myocardial infarction (HCC) 04/25/2017  . Acute ST elevation myocardial infarction (STEMI) of inferior wall (HCC) 04/25/2017  . Coronary artery disease   . DM (diabetes mellitus) (HCC)   . Family history of early CAD 04/25/2017  . Hyperlipidemia LDL goal <70 04/25/2017  . Nasal congestion     Medications:   (Not in a hospital admission)  Assessment: 12 YOM here with substernal chest pain for CABG tomorrow. Pharmacy consulted to start IV heparin for ACS. H/H and Plt wnl. He is not on any anticoagulation at home.   Goal of Therapy:  Heparin level 0.3-0.7 units/ml Monitor platelets by anticoagulation protocol: Yes   Plan:  -Heparin 4000 units IV once, then heparin 1100 units/hr  -F/u 6 hr HL -Monitor daily CBC and s/s of bleeding -CABG tomorrow   Vinnie Level, PharmD., BCPS Clinical Pharmacist Pager [phone]

## 2017-08-05 NOTE — Progress Notes (Signed)
      [address]       [place] [address]             [phone]     CARDIOTHORACIC SURGERY PROGRESS NOTE  Subjective: Patient is well-known to me from his previous consultation and was readmitted via the emergency department this afternoon with a prolonged episode of substernal chest discomfort which promptly resolved after administration of a single sublingual nitroglycerin tablet.  The patient has not had any further episodes of chest discomfort since.  He denies shortness of breath.  He otherwise feels well.  Objective: Vital signs in last 24 hours: Temp:  [98.3 F (36.8 C)] 98.3 F (36.8 C) (02/13 1740) Pulse Rate:  [57-77] 72 (02/13 1600) Cardiac Rhythm: Normal sinus rhythm (02/13 1034) Resp:  [11-27] 16 (02/13 1600) BP: (94-117)/(65-93) 104/69 (02/13 1600) SpO2:  [95 %-100 %] 96 % (02/13 1600) Weight:  [193 lb (87.5 kg)] 193 lb (87.5 kg) (02/13 8144)  Physical Exam:  Rhythm:   Sinus  Breath sounds: Clear  Heart sounds:  Regular rate and rhythm  Incisions:  n/a  Abdomen:  Soft nontender  Extremities:  Warm and well perfused   Intake/Output from previous day: No intake/output data recorded. Intake/Output this shift: Total I/O In: -  Out: 500 [Urine:500]  Lab Results: Recent Labs    08/05/17 0632  WBC 6.4  HGB 15.3  HCT 43.6  PLT 173   BMET:  Recent Labs    08/03/17 0235 08/05/17 0632  NA 137 137  K 3.8 3.8  CL 102 100*  CO2 24 24  GLUCOSE 127* 166*  BUN 17 17  CREATININE 1.18 1.32*  CALCIUM 9.0 9.4    CBG (last 3)  Recent Labs    08/03/17 0621 08/03/17 1113 08/04/17 1457  GLUCAP 129* 132* 106*   PT/INR:   Recent Labs    08/04/17 1515  LABPROT 13.9  INR 1.08    CXR:  CHEST  2 VIEW  COMPARISON:  Radiograph 08/01/2017  FINDINGS: The cardiomediastinal contours are normal. The lungs are clear. Pulmonary vasculature is normal. No consolidation, pleural effusion, or pneumothorax. No acute osseous abnormalities  are seen. Again seen moderate dextroscoliotic curvature of the thoracic spine.  IMPRESSION: No acute pulmonary process.   Electronically Signed   By: Rubye Oaks M.D.   On: 08/05/2017 06:59  Assessment/Plan:  Patient has severe ostial stenosis of the left anterior descending coronary artery with preserved left ventricular systolic function and presents with unstable angina pectoris.  He is currently pain-free and quite stable.  Initial cardiac enzymes were negative.  We plan to proceed with coronary artery bypass grafting in the morning.  I have again reviewed the indications, risks, and potential benefits of surgery with the patient.  All of his questions have been addressed.  I spent in excess of 15 minutes during the conduct of this hospital encounter and >50% of this time involved direct face-to-face encounter with the patient for counseling and/or coordination of their care.   Purcell Nails, MD 08/05/2017 6:07 PM

## 2017-08-06 ENCOUNTER — Inpatient Hospital Stay (HOSPITAL_COMMUNITY): Payer: Self-pay | Admitting: Anesthesiology

## 2017-08-06 ENCOUNTER — Other Ambulatory Visit: Payer: Self-pay

## 2017-08-06 ENCOUNTER — Inpatient Hospital Stay (HOSPITAL_COMMUNITY): Payer: Self-pay

## 2017-08-06 ENCOUNTER — Inpatient Hospital Stay (HOSPITAL_COMMUNITY)
Admission: RE | Admit: 2017-08-06 | Payer: Self-pay | Source: Ambulatory Visit | Admitting: Thoracic Surgery (Cardiothoracic Vascular Surgery)

## 2017-08-06 ENCOUNTER — Encounter (HOSPITAL_COMMUNITY): Payer: Self-pay | Admitting: Certified Registered"

## 2017-08-06 ENCOUNTER — Inpatient Hospital Stay (HOSPITAL_COMMUNITY): Payer: Self-pay | Admitting: Vascular Surgery

## 2017-08-06 ENCOUNTER — Inpatient Hospital Stay (HOSPITAL_COMMUNITY)
Admission: EM | Disposition: A | Discharge status: Home / Self Care | Payer: Self-pay | Source: Home / Self Care | Attending: Thoracic Surgery (Cardiothoracic Vascular Surgery)

## 2017-08-06 DIAGNOSIS — Z951 Presence of aortocoronary bypass graft: Secondary | ICD-10-CM

## 2017-08-06 HISTORY — PX: TEE WITHOUT CARDIOVERSION: SHX5443

## 2017-08-06 HISTORY — PX: CORONARY ARTERY BYPASS GRAFT: SHX141

## 2017-08-06 HISTORY — DX: Presence of aortocoronary bypass graft: Z95.1

## 2017-08-06 LAB — CBC
HCT: 38.7 % — ABNORMAL LOW (ref 39.0–52.0)
HCT: 34.8 % — ABNORMAL LOW (ref 39.0–52.0)
HEMATOCRIT: 45.3 % (ref 39.0–52.0)
HEMOGLOBIN: 13.4 g/dL (ref 13.0–17.0)
HEMOGLOBIN: 12.2 g/dL — AB (ref 13.0–17.0)
Hemoglobin: 15.8 g/dL (ref 13.0–17.0)
MCH: 32.6 pg (ref 26.0–34.0)
MCH: 31.7 pg (ref 26.0–34.0)
MCH: 32.1 pg (ref 26.0–34.0)
MCHC: 34.9 g/dL (ref 30.0–36.0)
MCHC: 34.6 g/dL (ref 30.0–36.0)
MCHC: 35.1 g/dL (ref 30.0–36.0)
MCV: 93.4 fL (ref 78.0–100.0)
MCV: 91.5 fL (ref 78.0–100.0)
MCV: 91.6 fL (ref 78.0–100.0)
PLATELETS: 180 10*3/uL (ref 150–400)
PLATELETS: 131 10*3/uL — AB (ref 150–400)
PLATELETS: 141 10*3/uL — AB (ref 150–400)
RBC: 4.85 MIL/uL (ref 4.22–5.81)
RBC: 4.23 MIL/uL (ref 4.22–5.81)
RBC: 3.8 MIL/uL — AB (ref 4.22–5.81)
RDW: 12 % (ref 11.5–15.5)
RDW: 11.9 % (ref 11.5–15.5)
RDW: 11.9 % (ref 11.5–15.5)
WBC: 8.9 10*3/uL (ref 4.0–10.5)
WBC: 13.2 10*3/uL — AB (ref 4.0–10.5)
WBC: 15.2 10*3/uL — ABNORMAL HIGH (ref 4.0–10.5)

## 2017-08-06 LAB — POCT I-STAT 3, ART BLOOD GAS (G3+)
Acid-base deficit: 2 mmol/L (ref 0.0–2.0)
Acid-base deficit: 5 mmol/L — ABNORMAL HIGH (ref 0.0–2.0)
BICARBONATE: 23.2 mmol/L (ref 20.0–28.0)
Bicarbonate: 21.5 mmol/L (ref 20.0–28.0)
O2 SAT: 99 %
O2 SAT: 99 %
PCO2 ART: 35.5 mmHg (ref 32.0–48.0)
PH ART: 7.287 — AB (ref 7.350–7.450)
PO2 ART: 153 mmHg — AB (ref 83.0–108.0)
Patient temperature: 35.2
TCO2: 24 mmol/L (ref 22–32)
TCO2: 23 mmol/L (ref 22–32)
pCO2 arterial: 45.1 mmHg (ref 32.0–48.0)
pH, Arterial: 7.416 (ref 7.350–7.450)
pO2, Arterial: 141 mmHg — ABNORMAL HIGH (ref 83.0–108.0)

## 2017-08-06 LAB — POCT I-STAT 4, (NA,K, GLUC, HGB,HCT)
GLUCOSE: 136 mg/dL — AB (ref 65–99)
HCT: 35 % — ABNORMAL LOW (ref 39.0–52.0)
HEMOGLOBIN: 11.9 g/dL — AB (ref 13.0–17.0)
Potassium: 3.9 mmol/L (ref 3.5–5.1)
Sodium: 142 mmol/L (ref 135–145)

## 2017-08-06 LAB — BASIC METABOLIC PANEL
ANION GAP: 12 (ref 5–15)
BUN: 15 mg/dL (ref 6–20)
CALCIUM: 9.4 mg/dL (ref 8.9–10.3)
CHLORIDE: 101 mmol/L (ref 101–111)
CO2: 26 mmol/L (ref 22–32)
Creatinine, Ser: 1.21 mg/dL (ref 0.61–1.24)
GFR calc Af Amer: >60 mL/min (ref >60)
GFR calc non Af Amer: >60 mL/min (ref >60)
GLUCOSE: 123 mg/dL — AB (ref 65–99)
Potassium: 3.7 mmol/L (ref 3.5–5.1)
Sodium: 139 mmol/L (ref 135–145)

## 2017-08-06 LAB — POCT I-STAT, CHEM 8
BUN: 14 mg/dL (ref 6–20)
BUN: 13 mg/dL (ref 6–20)
BUN: 12 mg/dL (ref 6–20)
BUN: 12 mg/dL (ref 6–20)
CHLORIDE: 102 mmol/L (ref 101–111)
CHLORIDE: 106 mmol/L (ref 101–111)
CREATININE: 0.8 mg/dL (ref 0.61–1.24)
CREATININE: 0.9 mg/dL (ref 0.61–1.24)
Calcium, Ion: 1.23 mmol/L (ref 1.15–1.40)
Calcium, Ion: 1.27 mmol/L (ref 1.15–1.40)
Calcium, Ion: 1.23 mmol/L (ref 1.15–1.40)
Calcium, Ion: 1.24 mmol/L (ref 1.15–1.40)
Chloride: 104 mmol/L (ref 101–111)
Chloride: 105 mmol/L (ref 101–111)
Creatinine, Ser: 0.9 mg/dL (ref 0.61–1.24)
Creatinine, Ser: 0.8 mg/dL (ref 0.61–1.24)
GLUCOSE: 130 mg/dL — AB (ref 65–99)
GLUCOSE: 137 mg/dL — AB (ref 65–99)
Glucose, Bld: 160 mg/dL — ABNORMAL HIGH (ref 65–99)
Glucose, Bld: 169 mg/dL — ABNORMAL HIGH (ref 65–99)
HCT: 36 % — ABNORMAL LOW (ref 39.0–52.0)
HCT: 31 % — ABNORMAL LOW (ref 39.0–52.0)
HEMATOCRIT: 37 % — AB (ref 39.0–52.0)
HEMATOCRIT: 35 % — AB (ref 39.0–52.0)
HEMOGLOBIN: 12.6 g/dL — AB (ref 13.0–17.0)
HEMOGLOBIN: 10.5 g/dL — AB (ref 13.0–17.0)
Hemoglobin: 12.2 g/dL — ABNORMAL LOW (ref 13.0–17.0)
Hemoglobin: 11.9 g/dL — ABNORMAL LOW (ref 13.0–17.0)
POTASSIUM: 3.9 mmol/L (ref 3.5–5.1)
POTASSIUM: 4.1 mmol/L (ref 3.5–5.1)
POTASSIUM: 4.3 mmol/L (ref 3.5–5.1)
Potassium: 4.4 mmol/L (ref 3.5–5.1)
SODIUM: 140 mmol/L (ref 135–145)
SODIUM: 139 mmol/L (ref 135–145)
Sodium: 139 mmol/L (ref 135–145)
Sodium: 140 mmol/L (ref 135–145)
TCO2: 25 mmol/L (ref 22–32)
TCO2: 26 mmol/L (ref 22–32)
TCO2: 25 mmol/L (ref 22–32)
TCO2: 27 mmol/L (ref 22–32)

## 2017-08-06 LAB — APTT: aPTT: 32 seconds (ref 24–36)

## 2017-08-06 LAB — GLUCOSE, CAPILLARY
GLUCOSE-CAPILLARY: 97 mg/dL (ref 65–99)
GLUCOSE-CAPILLARY: 136 mg/dL — AB (ref 65–99)
GLUCOSE-CAPILLARY: 126 mg/dL — AB (ref 65–99)
Glucose-Capillary: 86 mg/dL (ref 65–99)
Glucose-Capillary: 110 mg/dL — ABNORMAL HIGH (ref 65–99)
Glucose-Capillary: 118 mg/dL — ABNORMAL HIGH (ref 65–99)
Glucose-Capillary: 134 mg/dL — ABNORMAL HIGH (ref 65–99)
Glucose-Capillary: 121 mg/dL — ABNORMAL HIGH (ref 65–99)

## 2017-08-06 LAB — PROTIME-INR
INR: 1.03
INR: 1.23
PROTHROMBIN TIME: 15.4 s — AB (ref 11.4–15.2)
Prothrombin Time: 13.4 seconds (ref 11.4–15.2)

## 2017-08-06 LAB — CREATININE, SERUM
CREATININE: 1.02 mg/dL (ref 0.61–1.24)
GFR calc Af Amer: >60 mL/min (ref >60)

## 2017-08-06 LAB — MAGNESIUM: MAGNESIUM: 2.4 mg/dL (ref 1.7–2.4)

## 2017-08-06 SURGERY — OFF PUMP CORONARY ARTERY BYPASS GRAFTING (CABG)
Anesthesia: General | Site: Chest

## 2017-08-06 MED ORDER — LACTATED RINGERS IV SOLN: 500.0000 mL | Freq: Once | INTRAVENOUS | Status: DC | PRN

## 2017-08-06 MED ORDER — CHLORHEXIDINE GLUCONATE 0.12% ORAL RINSE (MEDLINE KIT): 15.0000 mL | Freq: Two times a day (BID) | OROMUCOSAL | Status: DC

## 2017-08-06 MED ORDER — ACETAMINOPHEN 160 MG/5ML PO SOLN: 1000.0000 mg | Freq: Four times a day (QID) | ORAL | Status: DC

## 2017-08-06 MED ORDER — FENTANYL CITRATE (PF) 100 MCG/2ML IJ SOLN
INTRAMUSCULAR | Status: DC | PRN
  Administered 2017-08-06: 100 ug via INTRAVENOUS
  Administered 2017-08-06: 200 ug via INTRAVENOUS
  Administered 2017-08-06: 50 ug via INTRAVENOUS
  Administered 2017-08-06 (×2): 150 ug via INTRAVENOUS
  Administered 2017-08-06: 50 ug via INTRAVENOUS
  Administered 2017-08-06: 150 ug via INTRAVENOUS
  Administered 2017-08-06: 100 ug via INTRAVENOUS
  Administered 2017-08-06 (×3): 50 ug via INTRAVENOUS

## 2017-08-06 MED ORDER — 0.9 % SODIUM CHLORIDE (POUR BTL) OPTIME
TOPICAL | Status: DC | PRN
  Administered 2017-08-06: 5000 mL

## 2017-08-06 MED ORDER — ALBUMIN HUMAN 5 % IV SOLN
250.0000 mL | INTRAVENOUS | Status: AC | PRN
  Administered 2017-08-06 (×2): 250 mL via INTRAVENOUS

## 2017-08-06 MED ORDER — SODIUM CHLORIDE 0.9% FLUSH
3.0000 mL | Freq: Two times a day (BID) | INTRAVENOUS | Status: DC
  Administered 2017-08-07 – 2017-08-10 (×4): 3 mL via INTRAVENOUS

## 2017-08-06 MED ORDER — GLYCOPYRROLATE 0.2 MG/ML IV SOSY
PREFILLED_SYRINGE | INTRAVENOUS | Status: DC | PRN
  Administered 2017-08-06: .2 mg via INTRAVENOUS

## 2017-08-06 MED ORDER — OXYCODONE HCL 5 MG PO TABS
5.0000 mg | ORAL_TABLET | ORAL | Status: DC | PRN
  Administered 2017-08-06 – 2017-08-09 (×6): 10 mg via ORAL
  Administered 2017-08-09: 5 mg via ORAL
  Filled 2017-08-06 (×4): qty 2
  Filled 2017-08-06: qty 1
  Filled 2017-08-06 (×3): qty 2

## 2017-08-06 MED ORDER — SODIUM CHLORIDE 0.9 % IV SOLN
1.5000 g | Freq: Two times a day (BID) | INTRAVENOUS | Status: AC
  Administered 2017-08-06 – 2017-08-08 (×4): 1.5 g via INTRAVENOUS
  Filled 2017-08-06 (×4): qty 1.5

## 2017-08-06 MED ORDER — SODIUM CHLORIDE 0.9% FLUSH
10.0000 mL | Freq: Two times a day (BID) | INTRAVENOUS | Status: DC
  Administered 2017-08-07 – 2017-08-08 (×3): 10 mL

## 2017-08-06 MED ORDER — ACETAMINOPHEN 650 MG RE SUPP
650.0000 mg | Freq: Once | RECTAL | Status: AC
  Administered 2017-08-06: 650 mg via RECTAL

## 2017-08-06 MED ORDER — SODIUM CHLORIDE 0.45 % IV SOLN
INTRAVENOUS | Status: DC | PRN
  Administered 2017-08-06: 13:00:00 via INTRAVENOUS

## 2017-08-06 MED ORDER — SODIUM CHLORIDE 0.9% FLUSH: 10.0000 mL | INTRAVENOUS | Status: DC | PRN

## 2017-08-06 MED ORDER — BISACODYL 10 MG RE SUPP: 10.0000 mg | Freq: Every day | RECTAL | Status: DC

## 2017-08-06 MED ORDER — MORPHINE SULFATE (PF) 4 MG/ML IV SOLN
1.0000 mg | INTRAVENOUS | Status: DC | PRN
  Administered 2017-08-06: 4 mg via INTRAVENOUS
  Administered 2017-08-06: 2 mg via INTRAVENOUS
  Filled 2017-08-06 (×2): qty 1

## 2017-08-06 MED ORDER — LACTATED RINGERS IV SOLN
INTRAVENOUS | Status: DC | PRN
  Administered 2017-08-06: 07:00:00 via INTRAVENOUS

## 2017-08-06 MED ORDER — MAGNESIUM SULFATE 4 GM/100ML IV SOLN
4.0000 g | Freq: Once | INTRAVENOUS | Status: AC
  Administered 2017-08-06: 4 g via INTRAVENOUS
  Filled 2017-08-06: qty 100

## 2017-08-06 MED ORDER — ASPIRIN EC 325 MG PO TBEC: 325.0000 mg | DELAYED_RELEASE_TABLET | Freq: Every day | ORAL | Status: DC

## 2017-08-06 MED ORDER — ACETAMINOPHEN 160 MG/5ML PO SOLN: 650.0000 mg | Freq: Once | ORAL | Status: AC

## 2017-08-06 MED ORDER — ASPIRIN 81 MG PO CHEW: 324.0000 mg | CHEWABLE_TABLET | Freq: Every day | ORAL | Status: DC

## 2017-08-06 MED ORDER — MORPHINE SULFATE (PF) 4 MG/ML IV SOLN
1.0000 mg | INTRAVENOUS | Status: DC | PRN
  Administered 2017-08-07 (×2): 2 mg via INTRAVENOUS
  Filled 2017-08-06 (×2): qty 1

## 2017-08-06 MED ORDER — MIDAZOLAM HCL 2 MG/2ML IJ SOLN
INTRAMUSCULAR | Status: AC
  Administered 2017-08-06: 2 mg via INTRAVENOUS
  Filled 2017-08-06: qty 2

## 2017-08-06 MED ORDER — TRAMADOL HCL 50 MG PO TABS
50.0000 mg | ORAL_TABLET | ORAL | Status: DC | PRN
  Administered 2017-08-07 (×2): 100 mg via ORAL
  Filled 2017-08-06 (×2): qty 2

## 2017-08-06 MED ORDER — SODIUM CHLORIDE 0.9 % IV SOLN
INTRAVENOUS | Status: DC
  Administered 2017-08-06: 1.5 [IU]/h via INTRAVENOUS
  Filled 2017-08-06: qty 1

## 2017-08-06 MED ORDER — LIDOCAINE 2% (20 MG/ML) 5 ML SYRINGE
INTRAMUSCULAR | Status: DC | PRN
  Administered 2017-08-06: 100 mg via INTRAVENOUS

## 2017-08-06 MED ORDER — PANTOPRAZOLE SODIUM 40 MG PO TBEC
40.0000 mg | DELAYED_RELEASE_TABLET | Freq: Every day | ORAL | Status: DC
  Administered 2017-08-08 – 2017-08-10 (×3): 40 mg via ORAL
  Filled 2017-08-06 (×3): qty 1

## 2017-08-06 MED ORDER — ONDANSETRON HCL 4 MG/2ML IJ SOLN: 4.0000 mg | Freq: Four times a day (QID) | INTRAMUSCULAR | Status: DC | PRN

## 2017-08-06 MED ORDER — BISACODYL 5 MG PO TBEC
10.0000 mg | DELAYED_RELEASE_TABLET | Freq: Every day | ORAL | Status: DC
  Administered 2017-08-07 – 2017-08-10 (×4): 10 mg via ORAL
  Filled 2017-08-06 (×4): qty 2

## 2017-08-06 MED ORDER — MIDAZOLAM HCL 2 MG/2ML IJ SOLN
2.0000 mg | INTRAMUSCULAR | Status: DC | PRN
  Administered 2017-08-06 (×2): 2 mg via INTRAVENOUS

## 2017-08-06 MED ORDER — PHENYLEPHRINE 40 MCG/ML (10ML) SYRINGE FOR IV PUSH (FOR BLOOD PRESSURE SUPPORT)
PREFILLED_SYRINGE | INTRAVENOUS | Status: DC | PRN
  Administered 2017-08-06: 60 ug via INTRAVENOUS
  Administered 2017-08-06 (×3): 80 ug via INTRAVENOUS

## 2017-08-06 MED ORDER — ONDANSETRON HCL 4 MG/2ML IJ SOLN
INTRAMUSCULAR | Status: DC | PRN
  Administered 2017-08-06: 4 mg via INTRAVENOUS

## 2017-08-06 MED ORDER — MILRINONE LACTATE IN DEXTROSE 20-5 MG/100ML-% IV SOLN
0.1250 ug/kg/min | INTRAVENOUS | Status: DC
  Filled 2017-08-06: qty 100

## 2017-08-06 MED ORDER — CHLORHEXIDINE GLUCONATE CLOTH 2 % EX PADS
6.0000 | MEDICATED_PAD | Freq: Every day | CUTANEOUS | Status: DC
  Administered 2017-08-06 – 2017-08-08 (×3): 6 via TOPICAL

## 2017-08-06 MED ORDER — NITROGLYCERIN IN D5W 200-5 MCG/ML-% IV SOLN: 0.0000 ug/min | INTRAVENOUS | Status: DC

## 2017-08-06 MED ORDER — POTASSIUM CHLORIDE 10 MEQ/50ML IV SOLN
10.0000 meq | INTRAVENOUS | Status: AC
  Administered 2017-08-06 (×3): 10 meq via INTRAVENOUS

## 2017-08-06 MED ORDER — INSULIN REGULAR BOLUS VIA INFUSION
0.0000 [IU] | Freq: Three times a day (TID) | INTRAVENOUS | Status: DC
  Filled 2017-08-06: qty 10

## 2017-08-06 MED ORDER — ACETAMINOPHEN 500 MG PO TABS
1000.0000 mg | ORAL_TABLET | Freq: Four times a day (QID) | ORAL | Status: DC
  Administered 2017-08-06 – 2017-08-10 (×11): 1000 mg via ORAL
  Filled 2017-08-06 (×12): qty 2

## 2017-08-06 MED ORDER — LACTATED RINGERS IV SOLN
INTRAVENOUS | Status: DC
  Administered 2017-08-06: 14:00:00 via INTRAVENOUS

## 2017-08-06 MED ORDER — SODIUM CHLORIDE 0.9% FLUSH: 3.0000 mL | INTRAVENOUS | Status: DC | PRN

## 2017-08-06 MED ORDER — FAMOTIDINE IN NACL 20-0.9 MG/50ML-% IV SOLN
20.0000 mg | Freq: Two times a day (BID) | INTRAVENOUS | Status: DC
  Administered 2017-08-06: 20 mg via INTRAVENOUS
  Filled 2017-08-06: qty 50

## 2017-08-06 MED ORDER — MORPHINE SULFATE (PF) 2 MG/ML IV SOLN: 1.0000 mg | INTRAVENOUS | Status: AC | PRN

## 2017-08-06 MED ORDER — SODIUM CHLORIDE 0.9 % IV SOLN
0.0000 ug/min | INTRAVENOUS | Status: DC
  Administered 2017-08-07: 40 ug/min via INTRAVENOUS
  Filled 2017-08-06: qty 20

## 2017-08-06 MED ORDER — PROPOFOL 10 MG/ML IV BOLUS
INTRAVENOUS | Status: DC | PRN
  Administered 2017-08-06: 80 mg via INTRAVENOUS

## 2017-08-06 MED ORDER — ALBUMIN HUMAN 5 % IV SOLN
INTRAVENOUS | Status: DC | PRN
  Administered 2017-08-06: 11:00:00 via INTRAVENOUS

## 2017-08-06 MED ORDER — ROCURONIUM BROMIDE 10 MG/ML (PF) SYRINGE
PREFILLED_SYRINGE | INTRAVENOUS | Status: DC | PRN
  Administered 2017-08-06: 10 mg via INTRAVENOUS
  Administered 2017-08-06: 20 mg via INTRAVENOUS
  Administered 2017-08-06: 40 mg via INTRAVENOUS
  Administered 2017-08-06: 80 mg via INTRAVENOUS
  Administered 2017-08-06: 20 mg via INTRAVENOUS

## 2017-08-06 MED ORDER — DOCUSATE SODIUM 100 MG PO CAPS
200.0000 mg | ORAL_CAPSULE | Freq: Every day | ORAL | Status: DC
  Administered 2017-08-07 – 2017-08-10 (×4): 200 mg via ORAL
  Filled 2017-08-06 (×4): qty 2

## 2017-08-06 MED ORDER — ORAL CARE MOUTH RINSE
15.0000 mL | OROMUCOSAL | Status: DC
  Administered 2017-08-06 (×2): 15 mL via OROMUCOSAL

## 2017-08-06 MED ORDER — CHLORHEXIDINE GLUCONATE 0.12 % MT SOLN
15.0000 mL | OROMUCOSAL | Status: AC
  Administered 2017-08-06: 15 mL via OROMUCOSAL

## 2017-08-06 MED ORDER — MIDAZOLAM HCL 5 MG/5ML IJ SOLN
INTRAMUSCULAR | Status: DC | PRN
  Administered 2017-08-06 (×3): 2 mg via INTRAVENOUS

## 2017-08-06 MED ORDER — SODIUM CHLORIDE 0.9 % IV SOLN: INTRAVENOUS | Status: DC

## 2017-08-06 MED ORDER — ORAL CARE MOUTH RINSE
15.0000 mL | Freq: Two times a day (BID) | OROMUCOSAL | Status: DC
  Administered 2017-08-06 – 2017-08-10 (×7): 15 mL via OROMUCOSAL

## 2017-08-06 MED ORDER — DEXTROSE 5 % IV SOLN
INTRAVENOUS | Status: DC | PRN
  Administered 2017-08-06: 25 ug/min via INTRAVENOUS

## 2017-08-06 MED ORDER — SODIUM CHLORIDE 0.9 % IJ SOLN
OROMUCOSAL | Status: DC | PRN
  Administered 2017-08-06 (×3): 3 mL via TOPICAL

## 2017-08-06 MED ORDER — HEPARIN SODIUM (PORCINE) 1000 UNIT/ML IJ SOLN
INTRAMUSCULAR | Status: DC | PRN
  Administered 2017-08-06: 13000 [IU] via INTRAVENOUS

## 2017-08-06 MED ORDER — METOPROLOL TARTRATE 5 MG/5ML IV SOLN: 2.5000 mg | INTRAVENOUS | Status: DC | PRN

## 2017-08-06 MED ORDER — PROTAMINE SULFATE 10 MG/ML IV SOLN
INTRAVENOUS | Status: DC | PRN
  Administered 2017-08-06: 13 mg via INTRAVENOUS
  Administered 2017-08-06: 50 mg via INTRAVENOUS

## 2017-08-06 MED ORDER — LACTATED RINGERS IV SOLN
INTRAVENOUS | Status: DC | PRN
  Administered 2017-08-06 (×2): via INTRAVENOUS

## 2017-08-06 MED ORDER — SODIUM BICARBONATE 8.4 % IV SOLN
50.0000 meq | Freq: Once | INTRAVENOUS | Status: AC
  Administered 2017-08-06: 50 meq via INTRAVENOUS

## 2017-08-06 MED ORDER — DEXMEDETOMIDINE HCL 200 MCG/2ML IV SOLN
0.0000 ug/kg/h | INTRAVENOUS | Status: DC
  Administered 2017-08-06: 0.7 ug/kg/h via INTRAVENOUS
  Filled 2017-08-06: qty 2

## 2017-08-06 MED ORDER — VANCOMYCIN HCL IN DEXTROSE 1-5 GM/200ML-% IV SOLN
1000.0000 mg | Freq: Once | INTRAVENOUS | Status: AC
  Administered 2017-08-06: 1000 mg via INTRAVENOUS
  Filled 2017-08-06: qty 200

## 2017-08-06 MED ORDER — DEXAMETHASONE SODIUM PHOSPHATE 10 MG/ML IJ SOLN
INTRAMUSCULAR | Status: DC | PRN
  Administered 2017-08-06: 5 mg via INTRAVENOUS

## 2017-08-06 MED ORDER — SODIUM CHLORIDE 0.9 % IV SOLN
INTRAVENOUS | Status: DC | PRN
  Administered 2017-08-06: 11:00:00 via INTRAVENOUS

## 2017-08-06 MED ORDER — SODIUM CHLORIDE 0.9 % IV SOLN
INTRAVENOUS | Status: AC
  Administered 2017-08-06: 13:00:00 via INTRAVENOUS

## 2017-08-06 MED ORDER — SODIUM CHLORIDE 0.9 % IV SOLN: 250.0000 mL | INTRAVENOUS | Status: DC

## 2017-08-06 SURGICAL SUPPLY — 137 items
APPLIER CLIP 9.375 MED OPEN (MISCELLANEOUS)
APPLIER CLIP 9.375 SM OPEN (CLIP) ×3
BAG DECANTER FOR FLEXI CONT (MISCELLANEOUS) ×6 IMPLANT
BANDAGE ACE 4X5 VEL STRL LF (GAUZE/BANDAGES/DRESSINGS) ×3 IMPLANT
BANDAGE ACE 6X5 VEL STRL LF (GAUZE/BANDAGES/DRESSINGS) ×3 IMPLANT
BASKET HEART (ORDER IN 25'S) (MISCELLANEOUS)
BASKET HEART (ORDER IN 25S) (MISCELLANEOUS) IMPLANT
BENZOIN TINCTURE PRP APPL 2/3 (GAUZE/BANDAGES/DRESSINGS) IMPLANT
BLADE CLIPPER SURG (BLADE) ×3 IMPLANT
BLADE STERNUM SYSTEM 6 (BLADE) ×3 IMPLANT
BLOWER MISTER CAL-MED (MISCELLANEOUS) ×3 IMPLANT
BNDG GAUZE ELAST 4 BULKY (GAUZE/BANDAGES/DRESSINGS) ×3 IMPLANT
CANISTER SUCT 3000ML PPV (MISCELLANEOUS) ×3 IMPLANT
CANNULA GUNDRY RCSP 15FR (MISCELLANEOUS) IMPLANT
CANNULA VESSEL 3MM BLUNT TIP (CANNULA) ×3 IMPLANT
CATH CPB KIT OWEN (MISCELLANEOUS) IMPLANT
CATH ROBINSON RED A/P 18FR (CATHETERS) IMPLANT
CATH THORACIC 28FR (CATHETERS) IMPLANT
CATH THORACIC 28FR RT ANG (CATHETERS) IMPLANT
CATH THORACIC 36FR (CATHETERS) ×3 IMPLANT
CATH THORACIC 36FR RT ANG (CATHETERS) IMPLANT
CLIP APPLIE 9.375 MED OPEN (MISCELLANEOUS) IMPLANT
CLIP APPLIE 9.375 SM OPEN (CLIP) ×2 IMPLANT
CLIP FOGARTY SPRING 6M (CLIP) IMPLANT
CLIP RETRACTION 3.0MM CORONARY (MISCELLANEOUS) ×3 IMPLANT
CLIP VESOCCLUDE MED 24/CT (CLIP) IMPLANT
CLIP VESOCCLUDE SM WIDE 24/CT (CLIP) IMPLANT
CONN ST 1/4X3/8  BEN (MISCELLANEOUS) ×3
CONN ST 1/4X3/8 BEN (MISCELLANEOUS) ×6 IMPLANT
COVER SURGICAL LIGHT HANDLE (MISCELLANEOUS) IMPLANT
CRADLE DONUT ADULT HEAD (MISCELLANEOUS) ×3 IMPLANT
DRAIN CHANNEL 32F RND 10.7 FF (WOUND CARE) ×6 IMPLANT
DRAPE CARDIOVASCULAR INCISE (DRAPES) ×1
DRAPE SLUSH/WARMER DISC (DRAPES) ×3 IMPLANT
DRAPE SRG 135X102X78XABS (DRAPES) ×2 IMPLANT
DRSG AQUACEL AG ADV 3.5X14 (GAUZE/BANDAGES/DRESSINGS) ×3 IMPLANT
DRSG COVADERM 4X14 (GAUZE/BANDAGES/DRESSINGS) IMPLANT
ELECT BLADE 4.0 EZ CLEAN MEGAD (MISCELLANEOUS) ×3
ELECT REM PT RETURN 9FT ADLT (ELECTROSURGICAL) ×6
ELECTRODE BLDE 4.0 EZ CLN MEGD (MISCELLANEOUS) ×2 IMPLANT
ELECTRODE REM PT RTRN 9FT ADLT (ELECTROSURGICAL) ×4 IMPLANT
FELT TEFLON 1X6 (MISCELLANEOUS) ×3 IMPLANT
GAUZE SPONGE 4X4 12PLY STRL (GAUZE/BANDAGES/DRESSINGS) ×6 IMPLANT
GLOVE BIO SURGEON STRL SZ 6 (GLOVE) ×6 IMPLANT
GLOVE BIO SURGEON STRL SZ 6.5 (GLOVE) ×15 IMPLANT
GLOVE BIO SURGEON STRL SZ7 (GLOVE) IMPLANT
GLOVE BIO SURGEON STRL SZ7.5 (GLOVE) IMPLANT
GLOVE BIOGEL M 6.5 STRL (GLOVE) ×9 IMPLANT
GLOVE BIOGEL PI IND STRL 6 (GLOVE) IMPLANT
GLOVE BIOGEL PI IND STRL 6.5 (GLOVE) ×10 IMPLANT
GLOVE BIOGEL PI IND STRL 7.0 (GLOVE) IMPLANT
GLOVE BIOGEL PI INDICATOR 6 (GLOVE)
GLOVE BIOGEL PI INDICATOR 6.5 (GLOVE) ×5
GLOVE BIOGEL PI INDICATOR 7.0 (GLOVE)
GLOVE EUDERMIC 7 POWDERFREE (GLOVE) IMPLANT
GLOVE ORTHO TXT STRL SZ7.5 (GLOVE) ×9 IMPLANT
GOWN STRL REUS W/ TWL LRG LVL3 (GOWN DISPOSABLE) ×12 IMPLANT
GOWN STRL REUS W/TWL LRG LVL3 (GOWN DISPOSABLE) ×6
HEMOSTAT POWDER SURGIFOAM 1G (HEMOSTASIS) ×9 IMPLANT
INSERT FOGARTY 61MM (MISCELLANEOUS) IMPLANT
INSERT FOGARTY XLG (MISCELLANEOUS) ×3 IMPLANT
KIT BASIN OR (CUSTOM PROCEDURE TRAY) ×3 IMPLANT
KIT ROOM TURNOVER OR (KITS) ×3 IMPLANT
KIT SUCTION CATH 14FR (SUCTIONS) ×3 IMPLANT
KIT VASOVIEW HEMOPRO VH 3000 (KITS) IMPLANT
LEAD PACING MYOCARDI (MISCELLANEOUS) ×3 IMPLANT
LINE VENT (MISCELLANEOUS) IMPLANT
MARKER GRAFT CORONARY BYPASS (MISCELLANEOUS) ×3 IMPLANT
NS IRRIG 1000ML POUR BTL (IV SOLUTION) ×15 IMPLANT
OFFPUMP STABILIZER SUV (MISCELLANEOUS) ×3 IMPLANT
PACK E OPEN HEART (SUTURE) ×3 IMPLANT
PACK OPEN HEART (CUSTOM PROCEDURE TRAY) ×3 IMPLANT
PAD ARMBOARD 7.5X6 YLW CONV (MISCELLANEOUS) ×6 IMPLANT
PENCIL BUTTON HOLSTER BLD 10FT (ELECTRODE) ×3 IMPLANT
POSITIONER ACROBAT-I OFFPUMP (MISCELLANEOUS) ×3 IMPLANT
PUNCH AORTIC ROTATE 4.0MM (MISCELLANEOUS) IMPLANT
PUNCH AORTIC ROTATE 4.5MM 8IN (MISCELLANEOUS) IMPLANT
PUNCH AORTIC ROTATE 5MM 8IN (MISCELLANEOUS) IMPLANT
SET CARDIOPLEGIA MPS 5001102 (MISCELLANEOUS) IMPLANT
SOLUTION ANTI FOG 6CC (MISCELLANEOUS) IMPLANT
SPONGE INTESTINAL PEANUT (DISPOSABLE) IMPLANT
SPONGE LAP 18X18 X RAY DECT (DISPOSABLE) ×3 IMPLANT
SPONGE LAP 4X18 X RAY DECT (DISPOSABLE) IMPLANT
STABILIZER COHN CARD 89 9340 (MISCELLANEOUS) IMPLANT
STRIP CLOSURE SKIN 1/2X4 (GAUZE/BANDAGES/DRESSINGS) IMPLANT
SUT BONE WAX W31G (SUTURE) ×3 IMPLANT
SUT ETHIBOND NAB MH 2-0 36IN (SUTURE) IMPLANT
SUT ETHIBOND X763 2 0 SH 1 (SUTURE) ×6 IMPLANT
SUT MNCRL AB 3-0 PS2 18 (SUTURE) ×6 IMPLANT
SUT MNCRL AB 4-0 PS2 18 (SUTURE) ×3 IMPLANT
SUT PDS AB 1 CTX 36 (SUTURE) ×6 IMPLANT
SUT PROLENE 3 0 SH DA (SUTURE) ×3 IMPLANT
SUT PROLENE 3 0 SH1 36 (SUTURE) IMPLANT
SUT PROLENE 4 0 RB 1 (SUTURE)
SUT PROLENE 4 0 SH DA (SUTURE) IMPLANT
SUT PROLENE 4-0 RB1 .5 CRCL 36 (SUTURE) IMPLANT
SUT PROLENE 5 0 C 1 24 (SUTURE) IMPLANT
SUT PROLENE 5 0 C 1 36 (SUTURE) IMPLANT
SUT PROLENE 6 0 C 1 30 (SUTURE) IMPLANT
SUT PROLENE 6 0 CC (SUTURE) IMPLANT
SUT PROLENE 7 0 BV 1 (SUTURE) ×6 IMPLANT
SUT PROLENE 7 0 BV1 MDA (SUTURE) IMPLANT
SUT PROLENE 7.0 RB 3 (SUTURE) ×18 IMPLANT
SUT PROLENE 8 0 BV175 6 (SUTURE) ×3 IMPLANT
SUT PROLENE BLUE 7 0 (SUTURE) ×3 IMPLANT
SUT PROLENE POLY MONO (SUTURE) IMPLANT
SUT SILK  1 MH (SUTURE) ×1
SUT SILK 1 MH (SUTURE) ×2 IMPLANT
SUT SILK 1 SH (SUTURE) IMPLANT
SUT SILK 2 0 SH CR/8 (SUTURE) IMPLANT
SUT SILK 3 0 SH CR/8 (SUTURE) IMPLANT
SUT STEEL 6MS V (SUTURE) IMPLANT
SUT STEEL STERNAL CCS#1 18IN (SUTURE) ×3 IMPLANT
SUT STEEL SZ 6 DBL 3X14 BALL (SUTURE) ×6 IMPLANT
SUT VIC AB 1 CTX 36 (SUTURE)
SUT VIC AB 1 CTX36XBRD ANBCTR (SUTURE) IMPLANT
SUT VIC AB 2-0 CT1 27 (SUTURE) ×1
SUT VIC AB 2-0 CT1 TAPERPNT 27 (SUTURE) ×2 IMPLANT
SUT VIC AB 2-0 CTX 27 (SUTURE) IMPLANT
SUT VIC AB 3-0 SH 27 (SUTURE)
SUT VIC AB 3-0 SH 27X BRD (SUTURE) IMPLANT
SUT VIC AB 3-0 X1 27 (SUTURE) IMPLANT
SUT VICRYL 4-0 PS2 18IN ABS (SUTURE) IMPLANT
SYSTEM HEARTSTRING SEAL 4.3 (VASCULAR PRODUCTS) ×8 IMPLANT
SYSTEM HEARTSTRING SEAL 4.3MM (VASCULAR PRODUCTS) ×4
SYSTEM SAHARA CHEST DRAIN ATS (WOUND CARE) ×6 IMPLANT
TAPE CLOTH SURG 4X10 WHT LF (GAUZE/BANDAGES/DRESSINGS) ×3 IMPLANT
TAPE PAPER MEDFIX 1IN X 10YD (GAUZE/BANDAGES/DRESSINGS) ×3 IMPLANT
TAPES RETRACTO (MISCELLANEOUS) ×3 IMPLANT
TOWEL GREEN STERILE (TOWEL DISPOSABLE) ×3 IMPLANT
TOWEL GREEN STERILE FF (TOWEL DISPOSABLE) ×3 IMPLANT
TRAY FOLEY SILVER 16FR TEMP (SET/KITS/TRAYS/PACK) ×3 IMPLANT
TUBE SUCTION CARDIAC 10FR (CANNULA) ×3 IMPLANT
TUBING INSUFFLATION (TUBING) ×3 IMPLANT
UNDERPAD 30X30 (UNDERPADS AND DIAPERS) ×3 IMPLANT
WAND VISUFLOW SURG W/TUBING (MISCELLANEOUS) IMPLANT
WATER STERILE IRR 1000ML POUR (IV SOLUTION) ×6 IMPLANT

## 2017-08-06 NOTE — Anesthesia Procedure Notes (Signed)
Central Venous Catheter Insertion Performed by: Heather Roberts, MD, anesthesiologist Start/End2/14/2019 6:46 AM, 08/06/2017 6:56 AM Patient location: Pre-op. Preanesthetic checklist: patient identified, IV checked, site marked, risks and benefits discussed, surgical consent, monitors and equipment checked, pre-op evaluation, timeout performed and anesthesia consent Position: Trendelenburg Lidocaine 1% used for infiltration and patient sedated Hand hygiene performed , maximum sterile barriers used  and Seldinger technique used Catheter size: 8.5 Fr Total catheter length 8. PA cath was placed.Sheath introducer Swan type:thermodilution PA Cath depth:50 Procedure performed using ultrasound guided technique. Ultrasound Notes:anatomy identified, needle tip was noted to be adjacent to the nerve/plexus identified, no ultrasound evidence of intravascular and/or intraneural injection and image(s) printed for medical record Attempts: 1 Following insertion, line sutured and dressing applied. Post procedure assessment: free fluid flow, blood return through all ports and no air  Patient tolerated the procedure well with no immediate complications.

## 2017-08-06 NOTE — Progress Notes (Signed)
Patient ID: Adrian Graham, male   DOB: 1971-05-19, 47 y.o.   MRN: [id number]  TCTS Evening Rounds:   Hemodynamically stable  CI = 2.9  Extubated and alert   Urine output good  CT output low  CBC    Component Value Date/Time   WBC 15.2 (H) 08/06/2017 1827   RBC 3.80 (L) 08/06/2017 1827   HGB 10.5 (L) 08/06/2017 1838   HGB 14.5 07/24/2017 1231   HCT 31.0 (L) 08/06/2017 1838   HCT 42.7 07/24/2017 1231   PLT 141 (L) 08/06/2017 1827   PLT 186 07/24/2017 1231   MCV 91.6 08/06/2017 1827   MCV 94 07/24/2017 1231   MCH 32.1 08/06/2017 1827   MCHC 35.1 08/06/2017 1827   RDW 11.9 08/06/2017 1827   RDW 12.5 07/24/2017 1231     BMET    Component Value Date/Time   NA 140 08/06/2017 1838   NA 140 07/24/2017 1231   K 4.4 08/06/2017 1838   CL 105 08/06/2017 1838   CO2 26 08/06/2017 0419   GLUCOSE 137 (H) 08/06/2017 1838   BUN 12 08/06/2017 1838   BUN 17 07/24/2017 1231   CREATININE 0.90 08/06/2017 1838   CALCIUM 9.4 08/06/2017 0419   GFRNONAA >60 08/06/2017 1827   GFRAA >60 08/06/2017 1827     A/P:  Stable postop course. Continue current plans

## 2017-08-06 NOTE — Op Note (Addendum)
CARDIOTHORACIC SURGERY OPERATIVE NOTE  Date of Procedure:  08/06/2017  Preoperative Diagnosis:   Severe Multi-vessel Coronary Artery Disease  Unstable Angina Pectoris  Postoperative Diagnosis: Same  Procedure:    Off-pump Coronary Artery Bypass Grafting x 2  Left Internal Mammary Artery to Distal Left Anterior Descending Coronary Artery  Sapheonous Vein Graft to Diagonal Branch Coronary Artery  Open Vein Harvest from Right Lower Leg  Surgeon: Salvatore Decent. Cornelius Moras, MD  Assistant: Ardelle Balls, PA-C  Anesthesia: Heather Roberts, MD  Operative Findings:  Mild global left ventricular systolic dysfunction  Small caliber but otherwise good quality left internal mammary artery conduit  Good quality saphenous vein conduit  Good quality target vessels for grafting    BRIEF CLINICAL NOTE AND INDICATIONS FOR SURGERY  Patient is a 47 year old male with history of multivessel coronary artery disease status post acute ST segment elevation lateral wall myocardial infarction on April 25, 2017 treated with PCI and stenting of the left circumflex coronary artery using a drug eluting stent.  At the time patient had moderate ostial stenosis of the left anterior descending coronary artery and mild nonobstructive disease in the right coronary artery which was treated medically.  The patient did well and was seen in follow-up in December by Dr. Katrinka Blazing at which time he was doing well.  Since then the patient has developed progressive symptoms of exertional angina.  He was brought in for elective diagnostic catheterization earlier today and was found to have high-grade ostial stenosis of the left anterior descending coronary artery with continued patency of the stent in the left circumflex coronary artery.  The patient's LAD stenosis was felt to be high risk for PCI and elective surgical consultation was requested.  The patient has been seen in consultation and counseled at length regarding the  indications, risks and potential benefits of surgery.  All questions have been answered, and the patient provides full informed consent for the operation as described.    DETAILS OF THE OPERATIVE PROCEDURE  Preparation:  The patient is brought to the operating room on the above mentioned date and central monitoring was established by the anesthesia team including placement of Swan-Ganz catheter and radial arterial line. The patient is placed in the supine position on the operating table.  Intravenous antibiotics are administered. General endotracheal anesthesia is induced uneventfully. A Foley catheter is placed.  Baseline transesophageal echocardiogram was performed.  Findings were notable for low normal LV systolic dysfunction.  There were no regional wall motion abnormalities and no valvular disease.  There was a tiny patent foramen ovale.  The patient's chest, abdomen, both groins, and both lower extremities are prepared and draped in a sterile manner. A time out procedure is performed.   Surgical Approach and Harvest of Conduit:  A median sternotomy incision was performed and the left internal mammary artery is dissected from the chest wall and prepared for bypass grafting. The left internal mammary artery is notably small in caliber but otherwise good quality conduit. Simultaneously, saphenous vein is obtained from the patient's right lower leg using open vein harvest technique. The saphenous vein is notably good quality conduit. After removal of the saphenous vein, the small surgical incisions in the lower extremity are closed with absorbable suture. Following systemic heparinization, the left internal mammary artery was transected distally noted to have excellent flow.  The pericardium is opened. The ascending aorta is normal in appearance.  The right pleura is opened widely to facilitate mobilization of the heart.  The [employer]  off-pump cardiac stabilization system is utilized to  facilitate off-pump coronary revascularization.  Both the apical suction cup and the U-shaped stabilization arm are utilized.  Elastic vessel loops are used for proximal and distal hemostatic control.  Intracoronary shunts are not utilized.  Off-pump Coronary Artery Bypass Grafting:   The diagonal l branch of the left anterior descending coronary artery was grafted using a reversed saphenous vein graft in an end-to-side fashion.  At the site of distal anastomosis the target vessel was good quality and measured approximately 1.5 mm in diameter.  The distal left anterior coronary artery was grafted with the left internal mammary artery in an end-to-side fashion.  At the site of distal anastomosis the target vessel was good quality and measured approximately 2.0 mm in diameter.   The single proximal vein graft anastomosis was placed directly to the ascending aorta using the [employer] Heartstring proximal hemostatic device without application of any clamps on the aorta.  Initially 3 different Heartstring devices were placed without success, but a fourth was placed successfully and the anastomosis completed uneventfully.  All proximal and distal coronary anastomoses were inspected for hemostasis and appropriate graft orientation.    Procedure Completion:  Epicardial pacing wires are fixed to the right atrial appendage.  Followup transesophageal echocardiogram performed after completion of all grafts revealed no changes from the preoperative exam.  Protamine was administered to reverse the anticoagulation.   The mediastinum and pleural space were inspected for hemostasis and irrigated with saline solution. The mediastinum and both pleural spaces were drained using 4 chest tubes placed through separate stab incisions inferiorly.  The soft tissues anterior to the aorta were reapproximated loosely. The sternum is closed with double strength sternal wire. The soft tissues anterior to the sternum were closed in  multiple layers and the skin is closed with a running subcuticular skin closure.  No blood products were administered during the operation.   Patient Disposition:  The patient tolerated the procedure well and is transported to the surgical intensive care in stable condition. There are no intraoperative complications. All sponge instrument and needle counts are verified correct at completion of the operation.    Salvatore Decent. Cornelius Moras MD 08/06/2017 11:55 AM

## 2017-08-06 NOTE — Anesthesia Procedure Notes (Signed)
Arterial Line Insertion Start/End2/14/2019 7:00 AM, 08/06/2017 7:11 AM Performed by: Jeani Hawking, CRNA, CRNA  Preanesthetic checklist: patient identified, IV checked, site marked, risks and benefits discussed, surgical consent, monitors and equipment checked, pre-op evaluation and anesthesia consent Right, radial was placed Catheter size: 20 G Hand hygiene performed  and maximum sterile barriers used   Attempts: 2 Procedure performed without using ultrasound guided technique. Following insertion, Biopatch. Post procedure assessment: normal  Patient tolerated the procedure well with no immediate complications.

## 2017-08-06 NOTE — Progress Notes (Signed)
  Echocardiogram Echocardiogram Transesophageal has been performed.  Janalyn Harder 08/06/2017, 10:24 AM

## 2017-08-06 NOTE — Progress Notes (Addendum)
Patient c/o SSCP dull heaviness 4/10 on pain scale. ON O2 at 3 l/m n.c. Skin warm and dry resp. Even and unlab. V.S.S. 1st NTG S.L. Given patient c/o of being hot and adiaphoric.  See vital sign flow sheet. 06:11 4/10  06:16 - pain 2/10- 06:18 Pain free. No longer adiaphoric Charge R.N. Aware and into see patient. 06:17 Card. Fellows page to see if can get Morphine call not return.Marland Kitchen

## 2017-08-06 NOTE — Procedures (Signed)
Extubation Procedure Note  Patient Details:   Name: Adrian Graham DOB: Nov 10, 1970 MRN: [id number]   Airway Documentation:     Evaluation  O2 sats: stable throughout Complications: No apparent complications Patient did tolerate procedure well. Bilateral Breath Sounds: Clear   Yes pt able to vocalize.   Pt able to breathe around deflated cuff. Pt extubated per Rapid wean protocol. No Stridor noted. Pt placed on 4L [place] and tolerating well. Pt has adequate cough. Obtained 78mLx3 on IS.   Loyal Jacobson [facility] 08/06/2017, 4:49 PM

## 2017-08-06 NOTE — Anesthesia Procedure Notes (Addendum)
Procedure Name: Intubation Date/Time: 08/06/2017 7:57 AM Performed by: Elliot Dally, CRNA Pre-anesthesia Checklist: Patient identified, Emergency Drugs available, Suction available and Patient being monitored Patient Re-evaluated:Patient Re-evaluated prior to induction Oxygen Delivery Method: Circle System Utilized Preoxygenation: Pre-oxygenation with 100% oxygen Induction Type: IV induction Ventilation: Mask ventilation without difficulty Laryngoscope Size: Miller and 2 Grade View: Grade I Tube type: Oral Tube size: 8.0 mm Number of attempts: 1 Airway Equipment and Method: Stylet and Oral airway Placement Confirmation: ETT inserted through vocal cords under direct vision,  positive ETCO2 and breath sounds checked- equal and bilateral Secured at: 23 cm Tube secured with: Tape Dental Injury: Teeth and Oropharynx as per pre-operative assessment

## 2017-08-06 NOTE — Transfer of Care (Signed)
Immediate Anesthesia Transfer of Care Note  Patient: Adrian Graham  Procedure(s) Performed: OFF PUMP CORONARY ARTERY BYPASS GRAFTING (CABG) x 2 WITH OPEN HARVESTING OF RIGHT LOWER SAPHENOUS VEIN (N/A Chest) TRANSESOPHAGEAL ECHOCARDIOGRAM (TEE) (N/A )  Patient Location: SICU  Anesthesia Type:General  Level of Consciousness: sedated and Patient remains intubated per anesthesia plan  Airway & Oxygen Therapy: Patient remains intubated per anesthesia plan and Patient placed on Ventilator (see vital sign flow sheet for setting)  Post-op Assessment: Report given to RN and Post -op Vital signs reviewed and stable  Post vital signs: Reviewed and stable  Last Vitals:  Vitals:   08/06/17 0611 08/06/17 0619  BP: 99/87 112/80  Pulse: 83 62  Resp: 17 (!) 22  Temp:    SpO2: 100% 100%    Last Pain:  Vitals:   08/06/17 0618  TempSrc:   PainSc: 0-No pain         Complications: No apparent anesthesia complications

## 2017-08-06 NOTE — Brief Op Note (Addendum)
08/05/2017 - 08/06/2017  11:26 AM  PATIENT:  Adrian Graham  47 y.o. male  PRE-OPERATIVE DIAGNOSIS:  CAD  POST-OPERATIVE DIAGNOSIS:  CAD  PROCEDURE: TRANSESOPHAGEAL ECHOCARDIOGRAM (TEE), MEDIAN STERNOTOMY for OFF PUMP CORONARY ARTERY BYPASS GRAFTING (CABG) x 2 (LIMA to LAD, SVG to DIAGONAL) WITH OPEN HARVESTING OF RIGHT LOWER GREATER SAPHENOUS VEIN   SURGEON:  Surgeon(s) and Role:    Purcell Nails, MD - Primary  PHYSICIAN ASSISTANT: Doree Fudge PA-C  ANESTHESIA:   general  EBL:  200 mL   DRAINS: Chest tubes placed in the mediastinal and pleural spaces   COUNTS CORRECT:  YES   DICTATION: .Dragon Dictation  PLAN OF CARE: Admit to inpatient   PATIENT DISPOSITION:  ICU - intubated and hemodynamically stable.   Delay start of Pharmacological VTE agent (>24hrs) due to surgical blood loss or risk of bleeding: yes

## 2017-08-06 NOTE — Progress Notes (Signed)
Family present patient belongings given to them.  Went with patient to prep op on monitor and 02 3 l/m n.c via stretcher. With no pain or SOB.

## 2017-08-06 NOTE — Anesthesia Postprocedure Evaluation (Signed)
Anesthesia Post Note  Patient: Adrian Graham  Procedure(s) Performed: OFF PUMP CORONARY ARTERY BYPASS GRAFTING (CABG) x 2 WITH OPEN HARVESTING OF RIGHT LOWER SAPHENOUS VEIN (N/A Chest) TRANSESOPHAGEAL ECHOCARDIOGRAM (TEE) (N/A )     Patient location during evaluation: SICU Anesthesia Type: General Level of consciousness: sedated Pain management: pain level controlled Vital Signs Assessment: post-procedure vital signs reviewed and stable Respiratory status: patient remains intubated per anesthesia plan Cardiovascular status: stable Postop Assessment: no apparent nausea or vomiting Anesthetic complications: no    Last Vitals:  Vitals:   08/06/17 1232 08/06/17 1245  BP:  (!) 123/100  Pulse: 65 80  Resp: 12 12  Temp: (!) 35.3 C (!) 35.3 C  SpO2: 100% 100%    Last Pain:  Vitals:   08/06/17 0618  TempSrc:   PainSc: 0-No pain                 Giordano Getman DANIEL

## 2017-08-06 NOTE — Progress Notes (Signed)
      [address]       [place] [address]             [phone]     CARDIOTHORACIC SURGERY PROGRESS NOTE  Subjective: Adrian Graham has been scheduled for Procedure(s): OFF PUMP CORONARY ARTERY BYPASS GRAFTING (CABG) (N/A) TRANSESOPHAGEAL ECHOCARDIOGRAM (TEE) (N/A) today.   Objective: Vital signs in last 24 hours: Temp:  [97.6 F (36.4 C)-98.4 F (36.9 C)] 97.6 F (36.4 C) (02/14 0429) Pulse Rate:  [57-77] 62 (02/14 0446) Cardiac Rhythm: Normal sinus rhythm (02/13 1900) Resp:  [11-27] 17 (02/14 0434) BP: (94-129)/(65-93) 106/77 (02/14 0446) SpO2:  [90 %-100 %] 100 % (02/14 0434) Weight:  [192 lb 8 oz (87.3 kg)-193 lb (87.5 kg)] 192 lb 8 oz (87.3 kg) (02/14 0225)  Physical Exam: Unchanged from previously   Intake/Output from previous day: 02/13 0701 - 02/14 0700 In: 688 [P.O.:600; I.V.:88] Out: 500 [Urine:500] Intake/Output this shift: Total I/O In: 688 [P.O.:600; I.V.:88] Out: -   Lab Results: Recent Labs    08/05/17 0632  WBC 6.4  HGB 15.3  HCT 43.6  PLT 173   BMET:  Recent Labs    08/05/17 0632  NA 137  K 3.8  CL 100*  CO2 24  GLUCOSE 166*  BUN 17  CREATININE 1.32*  CALCIUM 9.4    CBG (last 3)  Recent Labs    08/03/17 0621 08/03/17 1113 08/04/17 1457  GLUCAP 129* 132* 106*   PT/INR:   Recent Labs    08/04/17 1515  LABPROT 13.9  INR 1.08    Assessment/Plan:   The various methods of treatment have been discussed with the patient. After consideration of the risks, benefits and treatment options the patient has consented to the planned procedure.   The patient has been seen and labs reviewed. There are no changes in the patient's condition to prevent proceeding with the planned procedure today.   Purcell Nails, MD 08/06/2017 5:04 AM

## 2017-08-06 NOTE — Progress Notes (Signed)
Patient c/o SSCP skin warm and dry resp. Even and unlab. 4 or 5 on pain scale. Rn aware. O2 applied at 3 l/m n.c. !st NTG S.L. Given. Feels like a dull heaviness on my chest. )4:35 pain feel after one NTG S.L. B.P. 106/73 S.R. 73

## 2017-08-07 ENCOUNTER — Inpatient Hospital Stay (HOSPITAL_COMMUNITY): Payer: Self-pay

## 2017-08-07 ENCOUNTER — Encounter (HOSPITAL_COMMUNITY): Payer: Self-pay | Admitting: Thoracic Surgery (Cardiothoracic Vascular Surgery)

## 2017-08-07 DIAGNOSIS — Z951 Presence of aortocoronary bypass graft: Secondary | ICD-10-CM

## 2017-08-07 LAB — POCT I-STAT, CHEM 8
BUN: 15 mg/dL (ref 6–20)
CHLORIDE: 95 mmol/L — AB (ref 101–111)
Calcium, Ion: 1.24 mmol/L (ref 1.15–1.40)
Creatinine, Ser: 1.1 mg/dL (ref 0.61–1.24)
GLUCOSE: 174 mg/dL — AB (ref 65–99)
HCT: 34 % — ABNORMAL LOW (ref 39.0–52.0)
Hemoglobin: 11.6 g/dL — ABNORMAL LOW (ref 13.0–17.0)
POTASSIUM: 4.1 mmol/L (ref 3.5–5.1)
Sodium: 136 mmol/L (ref 135–145)
TCO2: 26 mmol/L (ref 22–32)

## 2017-08-07 LAB — GLUCOSE, CAPILLARY
GLUCOSE-CAPILLARY: 120 mg/dL — AB (ref 65–99)
GLUCOSE-CAPILLARY: 103 mg/dL — AB (ref 65–99)
GLUCOSE-CAPILLARY: 96 mg/dL (ref 65–99)
GLUCOSE-CAPILLARY: 153 mg/dL — AB (ref 65–99)
GLUCOSE-CAPILLARY: 133 mg/dL — AB (ref 65–99)
GLUCOSE-CAPILLARY: 136 mg/dL — AB (ref 65–99)
Glucose-Capillary: 102 mg/dL — ABNORMAL HIGH (ref 65–99)
Glucose-Capillary: 104 mg/dL — ABNORMAL HIGH (ref 65–99)
Glucose-Capillary: 139 mg/dL — ABNORMAL HIGH (ref 65–99)
Glucose-Capillary: 81 mg/dL (ref 65–99)

## 2017-08-07 LAB — CBC
HCT: 35.5 % — ABNORMAL LOW (ref 39.0–52.0)
HCT: 34.3 % — ABNORMAL LOW (ref 39.0–52.0)
Hemoglobin: 12.3 g/dL — ABNORMAL LOW (ref 13.0–17.0)
Hemoglobin: 11.6 g/dL — ABNORMAL LOW (ref 13.0–17.0)
MCH: 32 pg (ref 26.0–34.0)
MCH: 31.8 pg (ref 26.0–34.0)
MCHC: 34.6 g/dL (ref 30.0–36.0)
MCHC: 33.8 g/dL (ref 30.0–36.0)
MCV: 92.4 fL (ref 78.0–100.0)
MCV: 94 fL (ref 78.0–100.0)
PLATELETS: 110 10*3/uL — AB (ref 150–400)
Platelets: 154 10*3/uL (ref 150–400)
RBC: 3.84 MIL/uL — AB (ref 4.22–5.81)
RBC: 3.65 MIL/uL — ABNORMAL LOW (ref 4.22–5.81)
RDW: 12.1 % (ref 11.5–15.5)
RDW: 12.4 % (ref 11.5–15.5)
WBC: 17.9 10*3/uL — AB (ref 4.0–10.5)
WBC: 11.5 10*3/uL — ABNORMAL HIGH (ref 4.0–10.5)

## 2017-08-07 LAB — BASIC METABOLIC PANEL
ANION GAP: 10 (ref 5–15)
BUN: 10 mg/dL (ref 6–20)
CHLORIDE: 105 mmol/L (ref 101–111)
CO2: 22 mmol/L (ref 22–32)
CREATININE: 1.14 mg/dL (ref 0.61–1.24)
Calcium: 8.7 mg/dL — ABNORMAL LOW (ref 8.9–10.3)
GFR calc non Af Amer: >60 mL/min (ref >60)
Glucose, Bld: 116 mg/dL — ABNORMAL HIGH (ref 65–99)
POTASSIUM: 4.4 mmol/L (ref 3.5–5.1)
SODIUM: 137 mmol/L (ref 135–145)

## 2017-08-07 LAB — POCT I-STAT 3, ART BLOOD GAS (G3+)
BICARBONATE: 26.1 mmol/L (ref 20.0–28.0)
O2 SAT: 98 %
TCO2: 28 mmol/L (ref 22–32)
pCO2 arterial: 49.1 mmHg — ABNORMAL HIGH (ref 32.0–48.0)
pH, Arterial: 7.335 — ABNORMAL LOW (ref 7.350–7.450)
pO2, Arterial: 120 mmHg — ABNORMAL HIGH (ref 83.0–108.0)

## 2017-08-07 LAB — MAGNESIUM
MAGNESIUM: 1.9 mg/dL (ref 1.7–2.4)
Magnesium: 1.8 mg/dL (ref 1.7–2.4)

## 2017-08-07 LAB — CREATININE, SERUM
CREATININE: 1.23 mg/dL (ref 0.61–1.24)
GFR calc Af Amer: >60 mL/min (ref >60)
GFR calc non Af Amer: >60 mL/min (ref >60)

## 2017-08-07 MED ORDER — ASPIRIN EC 325 MG PO TBEC
325.0000 mg | DELAYED_RELEASE_TABLET | Freq: Every day | ORAL | Status: AC
  Administered 2017-08-07: 325 mg via ORAL
  Filled 2017-08-07: qty 1

## 2017-08-07 MED ORDER — INSULIN ASPART 100 UNIT/ML ~~LOC~~ SOLN
0.0000 [IU] | SUBCUTANEOUS | Status: DC
  Administered 2017-08-07 – 2017-08-08 (×5): 2 [IU] via SUBCUTANEOUS

## 2017-08-07 MED ORDER — CLOPIDOGREL BISULFATE 75 MG PO TABS
75.0000 mg | ORAL_TABLET | Freq: Every day | ORAL | Status: DC
  Administered 2017-08-08 – 2017-08-10 (×3): 75 mg via ORAL
  Filled 2017-08-07 (×3): qty 1

## 2017-08-07 MED ORDER — KETOROLAC TROMETHAMINE 15 MG/ML IJ SOLN
15.0000 mg | Freq: Four times a day (QID) | INTRAMUSCULAR | Status: AC
  Administered 2017-08-07 – 2017-08-08 (×5): 15 mg via INTRAVENOUS
  Filled 2017-08-07 (×5): qty 1

## 2017-08-07 MED ORDER — ASPIRIN 81 MG PO CHEW
81.0000 mg | CHEWABLE_TABLET | Freq: Every day | ORAL | Status: DC
  Administered 2017-08-08 – 2017-08-10 (×3): 81 mg via ORAL
  Filled 2017-08-07 (×3): qty 1

## 2017-08-07 MED FILL — Potassium Chloride Inj 2 mEq/ML: INTRAVENOUS | Qty: 20 | Status: AC

## 2017-08-07 MED FILL — Magnesium Sulfate Inj 50%: INTRAMUSCULAR | Qty: 10 | Status: AC

## 2017-08-07 MED FILL — Heparin Sodium (Porcine) Inj 1000 Unit/ML: INTRAMUSCULAR | Qty: 30 | Status: AC

## 2017-08-07 NOTE — Discharge Instructions (Signed)
Coronary Artery Bypass Grafting, Care After °These instructions give you information on caring for yourself after your procedure. Your doctor may also give you more specific instructions. Call your doctor if you have any problems or questions after your procedure. °Follow these instructions at home: °· Only take medicine as told by your doctor. Take medicines exactly as told. Do not stop taking medicines or start any new medicines without talking to your doctor first. °· Take your pulse as told by your doctor. °· Do deep breathing as told by your doctor. Use your breathing device (incentive spirometer), if given, to practice deep breathing several times a day. Support your chest with a pillow or your arms when you take deep breaths or cough. °· Keep the area clean, dry, and protected where the surgery cuts (incisions) were made. Remove bandages (dressings) only as told by your doctor. If strips were applied to surgical area, do not take them off. They fall off on their own. °· Check the surgery area daily for puffiness (swelling), redness, or leaking fluid. °· If surgery cuts were made in your legs: °? Avoid crossing your legs. °? Avoid sitting for long periods of time. Change positions every 30 minutes. °? Raise your legs when you are sitting. Place them on pillows. °· Wear stockings that help keep blood clots from forming in your legs (compression stockings). °· Only take sponge baths until your doctor says it is okay to take showers. Pat the surgery area dry. Do not rub the surgery area with a washcloth or towel. Do not bathe, swim, or use a hot tub until your doctor says it is okay. °· Eat foods that are high in fiber. These include raw fruits and vegetables, whole grains, beans, and nuts. Choose lean meats. Avoid canned, processed, and fried foods. °· Drink enough fluids to keep your pee (urine) clear or pale yellow. °· Weigh yourself every day. °· Rest and limit activity as told by your doctor. You may be told  to: °? Stop any activity if you have chest pain, shortness of breath, changes in heartbeat, or dizziness. Get help right away if this happens. °? Move around often for short amounts of time or take short walks as told by your doctor. Gradually become more active. You may need help to strengthen your muscles and build endurance. °? Avoid lifting, pushing, or pulling anything heavier than 10 pounds (4.5 kg) for at least 6 weeks after surgery. °· Do not drive until your doctor says it is okay. °· Ask your doctor when you can go back to work. °· Ask your doctor when you can begin sexual activity again. °· Follow up with your doctor as told. °Contact a doctor if: °· You have puffiness, redness, more pain, or fluid draining from the incision site. °· You have a fever. °· You have puffiness in your ankles or legs. °· You have pain in your legs. °· You gain 2 or more pounds (0.9 kg) a day. °· You feel sick to your stomach (nauseous) or throw up (vomit). °· You have watery poop (diarrhea). °Get help right away if: °· You have chest pain that goes to your jaw or arms. °· You have shortness of breath. °· You have a fast or irregular heartbeat. °· You notice a "clicking" in your breastbone when you move. °· You have numbness or weakness in your arms or legs. °· You feel dizzy or light-headed. °This information is not intended to replace advice given to you by   your health care provider. Make sure you discuss any questions you have with your health care provider. °Document Released: 06/14/2013 Document Revised: 11/15/2015 Document Reviewed: 11/16/2012 °Elsevier Interactive Patient Education © 2017 [employer] ° °

## 2017-08-07 NOTE — Care Management Note (Signed)
Case Management Note Donn Pierini RN, BSN Unit 4E-Case Manager-- 2H coverage [phone]   Patient Details  Name: Adrian Graham MRN: [id number] Date of Birth: 1970-11-27  Subjective/Objective:   Pt admitted s/p CABGx2                Action/Plan: PTA pt lived at home, independent- CM to follow for transition of care needs  Expected Discharge Date:                  Expected Discharge Plan:  Home/Self Care  In-House Referral:  NA  Discharge planning Services  CM Consult  Post Acute Care Choice:    Choice offered to:     DME Arranged:    DME Agency:     HH Arranged:    HH Agency:     Status of Service:  In process, will continue to follow  If discussed at Long Length of Stay Meetings, dates discussed:   Discharge Disposition:    Additional Comments:  Darrold Span, RN 08/07/2017, 11:27 AM

## 2017-08-07 NOTE — Progress Notes (Signed)
      [address]       [place] [address]             [phone]        CARDIOTHORACIC SURGERY PROGRESS NOTE   R1 Day Post-Op Procedure(s) (LRB): OFF PUMP CORONARY ARTERY BYPASS GRAFTING (CABG) x 2 WITH OPEN HARVESTING OF RIGHT LOWER SAPHENOUS VEIN (N/A) TRANSESOPHAGEAL ECHOCARDIOGRAM (TEE) (N/A)  Subjective: Looks good.  Mild soreness in chest.  No SOB.  No nausea.  Objective: Vital signs: BP Readings from Last 1 Encounters:  08/07/17 106/72   Pulse Readings from Last 1 Encounters:  08/07/17 93   Resp Readings from Last 1 Encounters:  08/07/17 (!) 28   Temp Readings from Last 1 Encounters:  08/07/17 100.2 F (37.9 C)    Hemodynamics: PAP: (14-31)/(5-17) 25/15 CO:  [4.1 L/min-7 L/min] 7 L/min CI:  [2 L/min/m2-3.5 L/min/m2] 3.5 L/min/m2  Physical Exam:  Rhythm:   sinus  Breath sounds: clear  Heart sounds:  RRR  Incisions:  Dressings dry, intact  Abdomen:  Soft, non-distended, non-tender  Extremities:  Warm, well-perfused  Chest tubes:  low volume thin serosanguinous output, no air leak    Intake/Output from previous day: 02/14 0701 - 02/15 0700 In: 6452.7 [I.V.:5122.7; Blood:430; IV Piggyback:900] Out: 4860 [Urine:3160; Emesis/NG output:100; Blood:630; Chest Tube:970] Intake/Output this shift: No intake/output data recorded.  Lab Results:  CBC: Recent Labs    08/06/17 1827 08/06/17 1838 08/07/17 0353  WBC 15.2*  --  17.9*  HGB 12.2* 10.5* 12.3*  HCT 34.8* 31.0* 35.5*  PLT 141*  --  154    BMET:  Recent Labs    08/06/17 0419  08/06/17 1838 08/07/17 0353  NA 139   < > 140 137  K 3.7   < > 4.4 4.4  CL 101   < > 105 105  CO2 26  --   --  22  GLUCOSE 123*   < > 137* 116*  BUN 15   < > 12 10  CREATININE 1.21   < > 0.90 1.14  CALCIUM 9.4  --   --  8.7*   < > = values in this interval not displayed.     PT/INR:   Recent Labs    08/06/17 1221  LABPROT 15.4*  INR 1.23    CBG (last 3)  Recent Labs     08/06/17 2358 08/07/17 0102 08/07/17 0159  GLUCAP 104* 103* 96    ABG    Component Value Date/Time   PHART 7.287 (L) 08/06/2017 1620   PCO2ART 45.1 08/06/2017 1620   PO2ART 141.0 (H) 08/06/2017 1620   HCO3 21.5 08/06/2017 1620   TCO2 27 08/06/2017 1838   ACIDBASEDEF 5.0 (H) 08/06/2017 1620   O2SAT 99.0 08/06/2017 1620    CXR: Mild bibasilar ATX  EKG: NSR w/out acute ischemic changes    Assessment/Plan: S/P Procedure(s) (LRB): OFF PUMP CORONARY ARTERY BYPASS GRAFTING (CABG) x 2 WITH OPEN HARVESTING OF RIGHT LOWER SAPHENOUS VEIN (N/A) TRANSESOPHAGEAL ECHOCARDIOGRAM (TEE) (N/A)  Doing well POD1 Maintaining NSR w/ stable hemodynamics on very low dose Neo Expected post op atelectasis, mild Expected post op acute blood loss anemia, very mild   Mobilize  D/C lines D/C tubes later today or tomorrow, depending on output  Resume DAPT, statin, low dose beta blocker  Transfer step down later today or tomorrow depending on progress   Purcell Nails, MD 08/07/2017 7:49 AM

## 2017-08-07 NOTE — Discharge Summary (Signed)
Physician Discharge Summary  Patient ID: Adrian Graham MRN: [id number] DOB/AGE: [birth year] 47 y.o.  Admit date: 08/05/2017 Discharge date: 08/10/2017  Admission Diagnoses: Severe coronary artery disease  Discharge Diagnoses:  Principal Problem:   S/P off-pump CABG x 2 Active Problems:   Unstable angina [facility])  Patient Active Problem List   Diagnosis Date Noted  . S/P off-pump CABG x 2 08/06/2017  . Unstable angina (HCC) 08/05/2017  . CAD-residual CAD post PCI 08/02/2017  . Non-insulin dependent type 2 diabetes mellitus (HCC) 08/02/2017  . Chest pain 08/01/2017  . Hyperglycemia 08/01/2017  . Accelerating angina (HCC)   . CAD -S/P PCI with DES Nov 2018 06/09/2017  . History of STEMI-Nov 2018 04/25/2017  . Hyperlipidemia LDL goal <70 04/25/2017  . Family history of early CAD 04/25/2017   HPI: Patient is a 47 year old male with history of multivessel coronary artery disease status post acute ST segment elevation lateral wall myocardial infarction on April 25, 2017 treated with PCI and stenting of the left circumflex coronary artery using a drug eluting stent. At the time patient had moderate ostial stenosis of the left anterior descending coronary artery and mild nonobstructive disease in the right coronary artery which was treated medically. The patient did well and was seen in follow-up in December by Dr. Katrinka Blazing at which time he was doing well. Since then the patient has developed progressive symptoms of exertional angina. He was brought in for elective diagnostic catheterization earlier today and was found to have high-grade ostial stenosis of the left anterior descending coronary artery with continued patency of the stent in the left circumflex coronary artery. The patient's LAD stenosis was felt to be high risk for PCI and elective surgical consultation was requested.  The patient has been seen in consultation and counseled at length regarding the indications, risks and potential  benefits of surgery.  All questions have been answered, and the patient provides full informed consent for the operation as described.    Discharged Condition: good  Hospital Course: The patient was admitted to the emergency department after developing left-sided chest pain.  He had been recently worked up following his STEMI for coronary artery disease and was seen in surgical consultation by Dr. Cornelius Moras who recommended CABG.  Dr. Cornelius Moras was made aware that he was readmitted to the hospital due to chest pain. His Brilinta was placed on hold.  Cardiology was reconsulted to assist with management.  He remained medically stable and on 08/06/2017 he was taken to the operating room where he underwent the below described procedure.  He tolerated well and was taken to the surgical intensive care unit in stable condition.  Postoperative hospital course:  The patient has progressed quite nicely.  He was weaned from the ventilator without difficulty using standard protocols.  He has remained hemodynamically stable.  He has maintained normal sinus rhythm.  He has a very mild expected acute blood loss anemia which is stable.  He has mild postoperative atelectasis which is improving using standard pulmonary toilet and incentive spirometry.  All routine lines, monitors and drainage devices have been discontinued in the standard fashion.  At time of discharge the patient will be restarted on his DAPT and he has been started on low-dose beta-blocker as well as statin. He is tolerating gradually increasing activities using standard cardiac rehab modalities.  Incisions are noted to be healing well without evidence.  Oxygen has been weaned and he maintains good saturations on room air.  Renal function has remained within  normal limits and most recent BUN and creatinine are 13/1.16 respectively.  Patient will require aggressive lifestyle modification particular nutrition for type II insulin resistance with most recent hemoglobin  A1c 6.6 and previous measurements greater than 8.  At time of discharge the patient is felt to be quite stable.   Consults: None  Significant Diagnostic Studies: Routine postoperative labs and serial chest x-rays.  Treatments: surgery:  CARDIOTHORACIC SURGERY OPERATIVE NOTE  Date of Procedure:                08/06/2017  Preoperative Diagnosis:        Severe Multi-vessel Coronary Artery Disease  Unstable Angina Pectoris  Postoperative Diagnosis:    Same  Procedure:        Off-pump Coronary Artery Bypass Grafting x 2             Left Internal Mammary Artery to Distal Left Anterior Descending Coronary Artery             Sapheonous Vein Graft to Diagonal Branch Coronary Artery             Open Vein Harvest from Right Lower Leg  Surgeon:        Salvatore Decent. Cornelius Moras, MD  Assistant:       Ardelle Balls, PA-C  Anesthesia:    Heather Roberts, MD  Operative Findings: ? Mild global left ventricular systolic dysfunction ? Small caliber but otherwise good quality left internal mammary artery conduit ? Good quality saphenous vein conduit ? Good quality target vessels for grafting  Disposition: 01-Home or Self Care  Discharge Instructions    Amb Referral to Cardiac Rehabilitation   Complete by:  As directed    Referring to [place] CRP 2   Diagnosis:  CABG   CABG X ___:  2     Allergies as of 08/10/2017      Reactions   Metformin And Related Nausea Only      Medication List    STOP taking these medications   isosorbide mononitrate 30 MG 24 hr tablet Commonly known as:  IMDUR     TAKE these medications   acetaminophen 500 MG tablet Commonly known as:  TYLENOL Take 2 tablets (1,000 mg total) by mouth every 6 (six) hours.   aspirin 81 MG chewable tablet Chew 1 tablet (81 mg total) daily by mouth.   atorvastatin 80 MG tablet Commonly known as:  LIPITOR Take 1 tablet (80 mg total) daily at 6 PM by mouth.   carvedilol 3.125 MG tablet Commonly known  as:  COREG Take 1 tablet (3.125 mg total) by mouth 2 (two) times daily with a meal.   clopidogrel 75 MG tablet Commonly known as:  PLAVIX Take 1 tablet (75 mg total) by mouth daily.   furosemide 40 MG tablet Commonly known as:  LASIX Take 1 tablet (40 mg total) by mouth daily.   nitroGLYCERIN 0.4 MG SL tablet Commonly known as:  NITROSTAT Place 1 tablet (0.4 mg total) every 5 (five) minutes as needed under the tongue for chest pain.   oxyCODONE 5 MG immediate release tablet Commonly known as:  Oxy IR/ROXICODONE Take 1 tablet (5 mg total) by mouth every 4 (four) hours as needed for severe pain.   Potassium Chloride ER 20 MEQ Tbcr Take 20 mEq by mouth daily.      Follow-up Information    Purcell Nails, MD Follow up.   Specialty:  Cardiothoracic Surgery Why:  Appointment to see Dr. Cornelius Moras  on 09/07/2017 at 11 AM.  Please obtain a chest x-ray at [facility] imaging at 10:30 AM.  [facility] imaging is located in the same office complex on the first floor. Contact information: [address] [place] [address] [phone]        Lyn Records, MD Follow up.   Specialty:  Cardiology Why:  Please see discharge paperwork for a scheduled 2-week cardiology follow-up appointment. Contact information: [address] [place] [address] [phone]          The patient has been discharged on:   1.Beta Blocker:  Yes [place]ienne   ]                              No   [   ]                              If No, reason:  2.Ace Inhibitor/ARB: Yes [   ]                                     No  [  n  ]                                     If No, reason:low BP(relative)  3.Statin:   Yes [ y  ]                  No  [   ]                  If No, reason:  4.Ecasa:  Yes  [ y  ]                  No   [   ]                  If No, reason:  Signed: Lekeith Wulf 08/10/2017, 1:09 PM

## 2017-08-07 NOTE — Progress Notes (Signed)
Patient examined and record reviewed.Hemodynamics stable,labs satisfactory.Patient had stable day.Continue current care. Kathlee Nations Trigt III 08/07/2017

## 2017-08-08 ENCOUNTER — Inpatient Hospital Stay (HOSPITAL_COMMUNITY): Payer: Self-pay

## 2017-08-08 LAB — GLUCOSE, CAPILLARY
GLUCOSE-CAPILLARY: 126 mg/dL — AB (ref 65–99)
GLUCOSE-CAPILLARY: 121 mg/dL — AB (ref 65–99)
Glucose-Capillary: 173 mg/dL — ABNORMAL HIGH (ref 65–99)
Glucose-Capillary: 136 mg/dL — ABNORMAL HIGH (ref 65–99)

## 2017-08-08 LAB — CBC
HCT: 31.7 % — ABNORMAL LOW (ref 39.0–52.0)
Hemoglobin: 10.8 g/dL — ABNORMAL LOW (ref 13.0–17.0)
MCH: 32 pg (ref 26.0–34.0)
MCHC: 34.1 g/dL (ref 30.0–36.0)
MCV: 94.1 fL (ref 78.0–100.0)
PLATELETS: 98 10*3/uL — AB (ref 150–400)
RBC: 3.37 MIL/uL — AB (ref 4.22–5.81)
RDW: 12.3 % (ref 11.5–15.5)
WBC: 10.9 10*3/uL — AB (ref 4.0–10.5)

## 2017-08-08 LAB — BASIC METABOLIC PANEL
Anion gap: 7 (ref 5–15)
BUN: 15 mg/dL (ref 6–20)
CALCIUM: 8.6 mg/dL — AB (ref 8.9–10.3)
CHLORIDE: 102 mmol/L (ref 101–111)
CO2: 27 mmol/L (ref 22–32)
CREATININE: 1.21 mg/dL (ref 0.61–1.24)
Glucose, Bld: 132 mg/dL — ABNORMAL HIGH (ref 65–99)
Potassium: 4.1 mmol/L (ref 3.5–5.1)
Sodium: 136 mmol/L (ref 135–145)

## 2017-08-08 MED ORDER — SODIUM CHLORIDE 0.9 % IV SOLN: 250.0000 mL | INTRAVENOUS | Status: DC | PRN

## 2017-08-08 MED ORDER — INSULIN ASPART 100 UNIT/ML ~~LOC~~ SOLN
0.0000 [IU] | Freq: Three times a day (TID) | SUBCUTANEOUS | Status: DC
  Administered 2017-08-08: 4 [IU] via SUBCUTANEOUS
  Administered 2017-08-09: 8 [IU] via SUBCUTANEOUS
  Administered 2017-08-09 – 2017-08-10 (×3): 2 [IU] via SUBCUTANEOUS
  Administered 2017-08-10: 4 [IU] via SUBCUTANEOUS

## 2017-08-08 MED ORDER — SODIUM CHLORIDE 0.9% FLUSH: 3.0000 mL | INTRAVENOUS | Status: DC | PRN

## 2017-08-08 MED ORDER — MOVING RIGHT ALONG BOOK
Freq: Once | Status: AC
  Administered 2017-08-08: 12:00:00
  Filled 2017-08-08: qty 1

## 2017-08-08 MED ORDER — SODIUM CHLORIDE 0.9% FLUSH
3.0000 mL | Freq: Two times a day (BID) | INTRAVENOUS | Status: DC
  Administered 2017-08-08 – 2017-08-10 (×3): 3 mL via INTRAVENOUS

## 2017-08-08 MED ORDER — FUROSEMIDE 40 MG PO TABS
40.0000 mg | ORAL_TABLET | Freq: Every day | ORAL | Status: DC
  Administered 2017-08-09 – 2017-08-10 (×2): 40 mg via ORAL
  Filled 2017-08-08 (×2): qty 1

## 2017-08-08 NOTE — Progress Notes (Signed)
2 Days Post-Op Procedure(s) (LRB): OFF PUMP CORONARY ARTERY BYPASS GRAFTING (CABG) x 2 WITH OPEN HARVESTING OF RIGHT LOWER SAPHENOUS VEIN (N/A) TRANSESOPHAGEAL ECHOCARDIOGRAM (TEE) (N/A) Subjective: Progressing  nsr 100 cc out CTs last 18 hrs tx to 4 east Objective: Vital signs in last 24 hours: Temp:  [98.3 F (36.8 C)-99 F (37.2 C)] 99 F (37.2 C) (02/16 0800) Pulse Rate:  [77-104] 100 (02/16 0900) Cardiac Rhythm: Normal sinus rhythm (02/16 0400) Resp:  [12-32] 28 (02/16 0900) BP: (86-105)/(60-77) 100/73 (02/16 0900) SpO2:  [94 %-100 %] 94 % (02/16 0900) Weight:  [200 lb 13.4 oz (91.1 kg)] 200 lb 13.4 oz (91.1 kg) (02/16 0600)  Hemodynamic parameters for last 24 hours:    Intake/Output from previous day: 02/15 0701 - 02/16 0700 In: 992.6 [P.O.:720; I.V.:172.6; IV Piggyback:100] Out: 1055 [Urine:595; Chest Tube:460] Intake/Output this shift: Total I/O In: -  Out: 100 [Urine:100]       Exam    General- alert and comfortable    Neck- no JVD, no cervical adenopathy palpable, no carotid bruit   Lungs- clear without rales, wheezes   Cor- regular rate and rhythm, no murmur , gallop   Abdomen- soft, non-tender   Extremities - warm, non-tender, minimal edema   Neuro- oriented, appropriate, no focal weakness   Lab Results: Recent Labs    08/07/17 1659 08/07/17 1708 08/08/17 0414  WBC 11.5*  --  10.9*  HGB 11.6* 11.6* 10.8*  HCT 34.3* 34.0* 31.7*  PLT 110*  --  98*   BMET:  Recent Labs    08/07/17 0353  08/07/17 1708 08/08/17 0414  NA 137  --  136 136  K 4.4  --  4.1 4.1  CL 105  --  95* 102  CO2 22  --   --  27  GLUCOSE 116*  --  174* 132*  BUN 10  --  15 15  CREATININE 1.14   < > 1.10 1.21  CALCIUM 8.7*  --   --  8.6*   < > = values in this interval not displayed.    PT/INR:  Recent Labs    08/06/17 1221  LABPROT 15.4*  INR 1.23   ABG    Component Value Date/Time   PHART 7.335 (L) 08/06/2017 1749   HCO3 26.1 08/06/2017 1749   TCO2 26  08/07/2017 1708   ACIDBASEDEF 5.0 (H) 08/06/2017 1620   O2SAT 98.0 08/06/2017 1749   CBG (last 3)  Recent Labs    08/07/17 2327 08/08/17 0339 08/08/17 0723  GLUCAP 136* 126* 121*    Assessment/Plan: S/P Procedure(s) (LRB): OFF PUMP CORONARY ARTERY BYPASS GRAFTING (CABG) x 2 WITH OPEN HARVESTING OF RIGHT LOWER SAPHENOUS VEIN (N/A) TRANSESOPHAGEAL ECHOCARDIOGRAM (TEE) (N/A) Mobilize Diuresis See progression orders  Transfer telemetry   LOS: 3 days    Adrian Graham 08/08/2017

## 2017-08-09 ENCOUNTER — Inpatient Hospital Stay (HOSPITAL_COMMUNITY): Payer: Self-pay

## 2017-08-09 LAB — BASIC METABOLIC PANEL
Anion gap: 12 (ref 5–15)
BUN: 13 mg/dL (ref 6–20)
CO2: 24 mmol/L (ref 22–32)
Calcium: 8.6 mg/dL — ABNORMAL LOW (ref 8.9–10.3)
Chloride: 102 mmol/L (ref 101–111)
Creatinine, Ser: 1.16 mg/dL (ref 0.61–1.24)
GFR calc Af Amer: >60 mL/min (ref >60)
GFR calc non Af Amer: >60 mL/min (ref >60)
Glucose, Bld: 116 mg/dL — ABNORMAL HIGH (ref 65–99)
Potassium: 3.7 mmol/L (ref 3.5–5.1)
Sodium: 138 mmol/L (ref 135–145)

## 2017-08-09 LAB — GLUCOSE, CAPILLARY
GLUCOSE-CAPILLARY: 158 mg/dL — AB (ref 65–99)
GLUCOSE-CAPILLARY: 229 mg/dL — AB (ref 65–99)
Glucose-Capillary: 133 mg/dL — ABNORMAL HIGH (ref 65–99)
Glucose-Capillary: 91 mg/dL (ref 65–99)

## 2017-08-09 LAB — CBC
HCT: 32.7 % — ABNORMAL LOW (ref 39.0–52.0)
Hemoglobin: 11.2 g/dL — ABNORMAL LOW (ref 13.0–17.0)
MCH: 31.9 pg (ref 26.0–34.0)
MCHC: 34.3 g/dL (ref 30.0–36.0)
MCV: 93.2 fL (ref 78.0–100.0)
Platelets: 103 10*3/uL — ABNORMAL LOW (ref 150–400)
RBC: 3.51 MIL/uL — ABNORMAL LOW (ref 4.22–5.81)
RDW: 12.3 % (ref 11.5–15.5)
WBC: 7.1 10*3/uL (ref 4.0–10.5)

## 2017-08-09 NOTE — Progress Notes (Signed)
Patient ambulated in hallway with nursing staff. 450 feet. Back in room in chair will monitor patient. Cheralyn Oliver, Randall An RN

## 2017-08-09 NOTE — Progress Notes (Addendum)
[address]       [place] [address]             [phone]      3 Days Post-Op Procedure(s) (LRB): OFF PUMP CORONARY ARTERY BYPASS GRAFTING (CABG) x 2 WITH OPEN HARVESTING OF RIGHT LOWER SAPHENOUS VEIN (N/A) TRANSESOPHAGEAL ECHOCARDIOGRAM (TEE) (N/A) Subjective: Feels pretty well  Objective: Vital signs in last 24 hours: Temp:  [98 F (36.7 C)-99.1 F (37.3 C)] 98 F (36.7 C) (02/17 0620) Pulse Rate:  [65-100] 65 (02/17 0920) Cardiac Rhythm: Normal sinus rhythm (02/17 0814) Resp:  [15-29] 29 (02/17 0920) BP: (93-118)/(66-78) 118/70 (02/17 0920) SpO2:  [93 %-100 %] 100 % (02/17 0920) Weight:  [198 lb 6.4 oz (90 kg)] 198 lb 6.4 oz (90 kg) (02/17 0620)  Hemodynamic parameters for last 24 hours:    Intake/Output from previous day: 02/16 0701 - 02/17 0700 In: 600 [P.O.:600] Out: 1110 [Urine:1050; Chest Tube:60] Intake/Output this shift: No intake/output data recorded.  General appearance: alert, cooperative and no distress Heart: regular rate and rhythm Lungs: min dim in the bases Abdomen: benign Extremities: trace edema Wound: incis healing well  Lab Results: Recent Labs    08/08/17 0414 08/09/17 0229  WBC 10.9* 7.1  HGB 10.8* 11.2*  HCT 31.7* 32.7*  PLT 98* 103*   BMET:  Recent Labs    08/08/17 0414 08/09/17 0229  NA 136 138  K 4.1 3.7  CL 102 102  CO2 27 24  GLUCOSE 132* 116*  BUN 15 13  CREATININE 1.21 1.16  CALCIUM 8.6* 8.6*    PT/INR:  Recent Labs    08/06/17 1221  LABPROT 15.4*  INR 1.23   ABG    Component Value Date/Time   PHART 7.335 (L) 08/06/2017 1749   HCO3 26.1 08/06/2017 1749   TCO2 26 08/07/2017 1708   ACIDBASEDEF 5.0 (H) 08/06/2017 1620   O2SAT 98.0 08/06/2017 1749   CBG (last 3)  Recent Labs    08/08/17 1125 08/08/17 2104 08/09/17 0620  GLUCAP 173* 136* 133*    Meds Scheduled Meds: . acetaminophen  1,000 mg Oral Q6H  . aspirin  81 mg Oral Daily  . atorvastatin  80 mg Oral q1800  .  bisacodyl  10 mg Oral Daily   Or  . bisacodyl  10 mg Rectal Daily  . carvedilol  3.125 mg Oral BID WC  . Chlorhexidine Gluconate Cloth  6 each Topical Daily  . clopidogrel  75 mg Oral Daily  . docusate sodium  200 mg Oral Daily  . furosemide  40 mg Oral Daily  . insulin aspart  0-24 Units Subcutaneous TID AC & HS  . mouth rinse  15 mL Mouth Rinse BID  . pantoprazole  40 mg Oral Daily  . sodium chloride flush  10-40 mL Intracatheter Q12H  . sodium chloride flush  3 mL Intravenous Q12H  . sodium chloride flush  3 mL Intravenous Q12H   Continuous Infusions: . sodium chloride Stopped (08/07/17 0600)  . sodium chloride    . lactated ringers Stopped (08/07/17 1200)  . lactated ringers Stopped (08/07/17 0000)   PRN Meds:.sodium chloride, metoprolol tartrate, ondansetron (ZOFRAN) IV, oxyCODONE, sodium chloride flush, sodium chloride flush, sodium chloride flush, traMADol  Xrays Dg Chest 2 View  Result Date: 08/09/2017 CLINICAL DATA:  Post CABG x2 08/06/2016. EXAM: CHEST  2 VIEW COMPARISON:  08/08/2017 FINDINGS: Sternotomy wires are unchanged. Interval removal of multiple tubes and lines. Lungs are adequately inflated  with mild linear atelectasis over the left mid to lower lung. No significant effusion. Cardiomediastinal silhouette and remainder the exam is unchanged. IMPRESSION: Minimal patchy atelectatic change over the left mid to lower lung. Electronically Signed   By: Elberta Fortis M.D.   On: 08/09/2017 08:44   Dg Chest Port 1 View  Result Date: 08/08/2017 CLINICAL DATA:  Atelectasis EXAM: PORTABLE CHEST 1 VIEW COMPARISON:  08/07/2017 FINDINGS: Swan-Ganz catheter removed. Right jugular sheath remains in place. 3 chest tubes on the left. Left lower lobe atelectasis and effusion unchanged. No pneumothorax. Right basilar chest tube unchanged. Right basilar airspace disease unchanged. IMPRESSION: Swan-Ganz catheter removed. Bilateral chest tubes remain in place. Bibasilar airspace disease and  small left effusion unchanged. No pneumothorax. Electronically Signed   By: Marlan Palau M.D.   On: 08/08/2017 07:12    Assessment/Plan: S/P Procedure(s) (LRB): OFF PUMP CORONARY ARTERY BYPASS GRAFTING (CABG) x 2 WITH OPEN HARVESTING OF RIGHT LOWER SAPHENOUS VEIN (N/A) TRANSESOPHAGEAL ECHOCARDIOGRAM (TEE) (N/A)  1 conts to progress well 2 hemodyn stable with some tachycardia(mild) and low relative syst BP- cont current meds. No ACE-I/ARB for now 3 H/H stable, thrombocytopenia stable 4 mild volume overload, cont gentle diuresis - BUN/Cr are stable 5 cont routine pulm toilet /cardiac rehab 6 blood sugars have been mildly elevated and hemoglobin A1c on 08/02/2017 is 6.6.  He is on no previous diabetic medication regimen.  Previous hemoglobin A1c has been as high as 8.3 on 04/25/2017.  He will require aggressive lifestyle management for type II insulin resistance and should limit carbohydrate/glucose intake.  He will need close follow-up with his primary care as potentially he could require oral medications. 7 poss d/c in am  LOS: 4 days    Rowe Clack 08/09/2017  Doing well Counseled on importance of DM care Plan DC Mon  patient examined and medical record reviewed,agree with above note. Kathlee Nations Trigt III 08/09/2017

## 2017-08-09 NOTE — Progress Notes (Signed)
Patient ambulated in hallway with nursing staff. Back in room in chair to eat lunch. Will monitor patient. Ailene Royal, Randall An RN

## 2017-08-10 LAB — GLUCOSE, CAPILLARY
GLUCOSE-CAPILLARY: 176 mg/dL — AB (ref 65–99)
GLUCOSE-CAPILLARY: 134 mg/dL — AB (ref 65–99)

## 2017-08-10 MED ORDER — LACTULOSE 10 GM/15ML PO SOLN
20.0000 g | Freq: Every day | ORAL | Status: AC
  Administered 2017-08-10: 20 g via ORAL
  Filled 2017-08-10: qty 30

## 2017-08-10 MED ORDER — ACETAMINOPHEN 500 MG PO TABS: 1000.0000 mg | ORAL_TABLET | Freq: Four times a day (QID) | ORAL | 0 refills | Status: DC

## 2017-08-10 MED ORDER — OXYCODONE HCL 5 MG PO TABS: 5.0000 mg | ORAL_TABLET | ORAL | 0 refills | Status: DC | PRN

## 2017-08-10 MED ORDER — FUROSEMIDE 40 MG PO TABS: 40.0000 mg | ORAL_TABLET | Freq: Every day | ORAL | 0 refills | Status: DC

## 2017-08-10 MED ORDER — CLOPIDOGREL BISULFATE 75 MG PO TABS: 75.0000 mg | ORAL_TABLET | Freq: Every day | ORAL | 3 refills | Status: DC

## 2017-08-10 MED ORDER — POTASSIUM CHLORIDE ER 20 MEQ PO TBCR: 20.0000 meq | EXTENDED_RELEASE_TABLET | Freq: Every day | ORAL | 0 refills | Status: DC

## 2017-08-10 NOTE — Progress Notes (Signed)
Patient with only one set of EPW on right side. Wires pulled per order and per protocol, all ends intact. BP 104/72 heart rate  80 on monitor . Patient reminded to lie supine approximately one hour. Teran Knittle, Randall An RN

## 2017-08-10 NOTE — Care Management Note (Signed)
Case Management Note Donn Pierini RN, BSN Unit 4E-Case Manager-- 2H coverage [phone]   Patient Details  Name: Adrian Graham MRN: [id number] Date of Birth: May 01, 1971  Subjective/Objective:   Pt admitted s/p CABGx2                Action/Plan: PTA pt lived at home, independent- CM to follow for transition of care needs  Expected Discharge Date:  08/10/17               Expected Discharge Plan:  Home/Self Care  In-House Referral:  NA  Discharge planning Services  CM Consult  Post Acute Care Choice:  NA Choice offered to:  NA  DME Arranged:    DME Agency:     HH Arranged:    HH Agency:     Status of Service:  Completed, signed off  If discussed at Long Length of Stay Meetings, dates discussed:   Discharge Disposition: home/self care    Additional Comments:  08/10/17- 1340- Justise Ehmann RN, CM- pt for d/c home today- no CM needs noted for discharge.   Darrold Span, RN 08/10/2017, 1:41 PM

## 2017-08-10 NOTE — Progress Notes (Signed)
      [address]       [place] [address]             [phone]      4 Days Post-Op Procedure(s) (LRB): OFF PUMP CORONARY ARTERY BYPASS GRAFTING (CABG) x 2 WITH OPEN HARVESTING OF RIGHT LOWER SAPHENOUS VEIN (N/A) TRANSESOPHAGEAL ECHOCARDIOGRAM (TEE) (N/A)   Subjective:  Patient feels great.  Wants to go home.  + ambulation  No BM, + flatus  Objective: Vital signs in last 24 hours: Temp:  [98.4 F (36.9 C)-98.8 F (37.1 C)] 98.8 F (37.1 C) (02/18 0527) Pulse Rate:  [65-93] 93 (02/18 0527) Cardiac Rhythm: Normal sinus rhythm (02/17 1935) Resp:  [16-29] 18 (02/18 0527) BP: (101-118)/(65-78) 116/71 (02/18 0527) SpO2:  [98 %-100 %] 98 % (02/17 1930) Weight:  [197 lb 8 oz (89.6 kg)] 197 lb 8 oz (89.6 kg) (02/18 0527)  Intake/Output from previous day: 02/17 0701 - 02/18 0700 In: -  Out: 400 [Urine:400]  General appearance: alert, cooperative and no distress Heart: regular rate and rhythm Lungs: clear to auscultation bilaterally Abdomen: soft, non-tender; bowel sounds normal; no masses,  no organomegaly Extremities: edema trace Wound: clean and dry  Lab Results: Recent Labs    08/08/17 0414 08/09/17 0229  WBC 10.9* 7.1  HGB 10.8* 11.2*  HCT 31.7* 32.7*  PLT 98* 103*   BMET:  Recent Labs    08/08/17 0414 08/09/17 0229  NA 136 138  K 4.1 3.7  CL 102 102  CO2 27 24  GLUCOSE 132* 116*  BUN 15 13  CREATININE 1.21 1.16  CALCIUM 8.6* 8.6*    PT/INR: No results for input(s): LABPROT, INR in the last 72 hours. ABG    Component Value Date/Time   PHART 7.335 (L) 08/06/2017 1749   HCO3 26.1 08/06/2017 1749   TCO2 26 08/07/2017 1708   ACIDBASEDEF 5.0 (H) 08/06/2017 1620   O2SAT 98.0 08/06/2017 1749   CBG (last 3)  Recent Labs    08/09/17 1629 08/09/17 2036 08/10/17 0608  GLUCAP 229* 91 176*    Assessment/Plan: S/P Procedure(s) (LRB): OFF PUMP CORONARY ARTERY BYPASS GRAFTING (CABG) x 2 WITH OPEN HARVESTING OF RIGHT LOWER SAPHENOUS  VEIN (N/A) TRANSESOPHAGEAL ECHOCARDIOGRAM (TEE) (N/A)  1. CV- NSR, BP controlled- continue Lopressor 2. Pulm- no acute issues, continue IS 3. Renal- creatinine improved, down to 1.16, weight is improving, continue Lasix 4. Expected post operative blood loss anemia, mild Hgb at 11.2 5. CBGs- controlled, patient will require close glucose management with PCP 6. Dispo- patient stable, d/c EPW, lactulose for constipation, if remains stable after wire removal will d/c home today   LOS: 5 days    Lowella Dandy 08/10/2017

## 2017-08-10 NOTE — Progress Notes (Signed)
CARDIAC REHAB PHASE I   PRE:  Rate/Rhythm: 85 SR  BP:  Supine: 110/73  Sitting:  Standing:    SaO2: 97%RA  MODE:  Ambulation: 470 ft   POST:  Rate/Rhythm: 102 ST  BP:  Supine:   Sitting: 104/70  Standing:    SaO2: 98%RA 0915-1010 Pt walked 470 ft with steady gait independently. Tolerated well. To recliner after walk. Pt congratulated on quitting smoking, getting A1C down and exercising until CP started. He has gotten A1C to 6.6 with diet. Re enforced carb counting and heart healthy food choices. Gave ex ed, reviewed in the tube and sternal precautions, IS and CRP 2. Pt hopes that his new insurance kicks in and will cover CRP 2. Will refer to [facility] program and let them follow up. Offered discharge video but pt declined at this time. Feels comfortable with ed completed.   Luetta Nutting, RN BSN  08/10/2017 10:09 AM

## 2017-08-10 NOTE — Progress Notes (Addendum)
Patient given discharge instructions medication list and paper prescriptions along with follow up appointments. All questions were answered.  IV and tele dcd CT sutures also dcd and steri strips applied. Will discharge home as ordered.Transported to exit via wheelchair and nursing staff.  Elisabel Hanover, Randall An RN

## 2017-08-13 ENCOUNTER — Other Ambulatory Visit: Payer: Self-pay | Admitting: Nurse Practitioner

## 2017-08-25 ENCOUNTER — Encounter: Payer: Self-pay | Admitting: Nurse Practitioner

## 2017-08-25 ENCOUNTER — Ambulatory Visit (INDEPENDENT_AMBULATORY_CARE_PROVIDER_SITE_OTHER): Payer: Self-pay | Admitting: Nurse Practitioner

## 2017-08-25 VITALS — BP 132/80 | HR 77 | Ht 69.5 in | Wt 196.8 lb

## 2017-08-25 DIAGNOSIS — Z951 Presence of aortocoronary bypass graft: Secondary | ICD-10-CM

## 2017-08-25 DIAGNOSIS — I259 Chronic ischemic heart disease, unspecified: Secondary | ICD-10-CM

## 2017-08-25 DIAGNOSIS — E785 Hyperlipidemia, unspecified: Secondary | ICD-10-CM

## 2017-08-25 NOTE — Progress Notes (Signed)
CARDIOLOGY OFFICE NOTE  Date:  08/25/2017    Adrian Graham Date of Birth: [birth year] Medical Record #[id number]  PCP:  Buckner Malta, MD  Cardiologist:  Katrinka Blazing    Chief Complaint  Patient presents with  . Coronary Artery Disease    Post hospital visit - seen for Dr. Katrinka Blazing    History of Present Illness: Adrian Graham is a 47 y.o. male who presents today for a post hospital visit. Seen for Dr. Katrinka Blazing.   He has a history of HLD, DM and +FH forstrikinglyearly CAD,acute inferior ST elevation MI 04/25/2017 withcircumflexDES (Residual LAD and RCA disease).   Last seen by Dr. Katrinka Blazing in December and was doing well.   Seen by Georgianne Fick last month with chest pain - referred for repeat cath - got readmitted with chest pain and subsequently had CABG per Dr. Cornelius Moras.  Post op course was unremarkable. Noted TEE at time of surgery with small PFO with left to right shunt and PFO closure device present???? I do not see this in the operative note.   Comes in today. Here alone. He is doing very well. No real complaints. Home almost 2 weeks. Can't sleep yet in the bed. On no pain medicine other than Tylenol. Walking in the house. He was 10 days away from getting his health insurance when he was readmitted - so currently, no insurance. No fever, chills. Bowels are ok. Appetite is good. He feels like he is doing well overall and has no real concerns. Not smoking.   Past Medical History:  Diagnosis Date  . Acute myocardial infarction (HCC) 04/25/2017  . Acute ST elevation myocardial infarction (STEMI) of inferior wall (HCC) 04/25/2017  . Arthritis    "hands" (08/05/2017)  . Coronary artery disease   . Family history of early CAD 04/25/2017  . Hyperlipidemia LDL goal <70 04/25/2017  . Hypertension   . Nasal congestion   . Pre-diabetes   . 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 CARDIAC  CATHETERIZATION    . CARPAL TUNNEL RELEASE Left   . CORONARY ARTERY BYPASS GRAFT N/A 08/06/2017   Procedure: OFF PUMP CORONARY ARTERY BYPASS GRAFTING (CABG) x 2 WITH OPEN HARVESTING OF RIGHT LOWER SAPHENOUS VEIN;  Surgeon: Purcell Nails, MD;  Location: [facility] OR;  Service: Open Heart Surgery;  Laterality: N/A;  . CORONARY/GRAFT ACUTE MI REVASCULARIZATION N/A 04/25/2017   Procedure: Coronary/Graft Acute MI Revascularization;  Surgeon: Lyn Records, MD;  Location: [facility] INVASIVE CV LAB;  Service: Cardiovascular;  Laterality: N/A;  . LEFT HEART CATH AND CORONARY ANGIOGRAPHY N/A 04/25/2017   Procedure: LEFT HEART CATH AND CORONARY ANGIOGRAPHY;  Surgeon: Lyn Records, MD;  Location: [facility] INVASIVE CV LAB;  Service: Cardiovascular;  Laterality: N/A;  . LEFT HEART CATH AND CORONARY ANGIOGRAPHY N/A 07/28/2017   Procedure: LEFT HEART CATH AND CORONARY ANGIOGRAPHY;  Surgeon: Lyn Records, MD;  Location: [facility] INVASIVE CV LAB;  Service: Cardiovascular;  Laterality: N/A;  . TEE WITHOUT CARDIOVERSION N/A 08/06/2017   Procedure: TRANSESOPHAGEAL ECHOCARDIOGRAM (TEE);  Surgeon: Purcell Nails, MD;  Location: [facility] OR;  Service: Open Heart Surgery;  Laterality: N/A;  . WISDOM TOOTH EXTRACTION       Medications: Current Meds  Medication Sig  . acetaminophen (TYLENOL) 500 MG tablet Take 2 tablets (1,000 mg total) by mouth every 6 (six) hours.  Marland Kitchen  aspirin 81 MG chewable tablet Chew 1 tablet (81 mg total) daily by mouth.  Marland Kitchen atorvastatin (LIPITOR) 80 MG tablet Take 1 tablet (80 mg total) daily at 6 PM by mouth.  . carvedilol (COREG) 3.125 MG tablet TAKE 1 TABLET (3.125 MG TOTAL) BY MOUTH 2 (TWO) TIMES DAILY WITH A MEAL.  Marland Kitchen clopidogrel (PLAVIX) 75 MG tablet Take 1 tablet (75 mg total) by mouth daily.  . nitroGLYCERIN (NITROSTAT) 0.4 MG SL tablet Place 1 tablet (0.4 mg total) every 5 (five) minutes as needed under the tongue for chest pain.  Marland Kitchen oxyCODONE (OXY IR/ROXICODONE) 5 MG immediate release tablet Take 1 tablet (5 mg  total) by mouth every 4 (four) hours as needed for severe pain.     Allergies: Allergies  Allergen Reactions  . Metformin And Related Nausea Only    Social History: The patient  reports that he quit smoking about 3 months ago. His smoking use included e-cigarettes and cigarettes. He has a 10.00 pack-year smoking history. He has quit using smokeless tobacco. His smokeless tobacco use included chew. He reports that he does not drink alcohol or use drugs.   Family History: The patient's family history includes CAD in his brother; CAD (age of onset: 34) in his mother; Cancer in his brother; Heart attack in his brother and brother; Heart attack (age of onset: 69) in his father.   Review of Systems: Please see the history of present illness.   Otherwise, the review of systems is positive for none.   All other systems are reviewed and negative.   Physical Exam: VS:  BP 132/80 (BP Location: Left Arm, Patient Position: Sitting, Cuff Size: Normal)   Pulse 77   Ht 5' 9.5" (1.765 m)   Wt 196 lb 12.8 oz (89.3 kg)   BMI 28.65 kg/m  .  BMI Body mass index is 28.65 kg/m.  Wt Readings from Last 3 Encounters:  08/25/17 196 lb 12.8 oz (89.3 kg)  08/10/17 197 lb 8 oz (89.6 kg)  08/04/17 193 lb 8 oz (87.8 kg)    General: Pleasant. Looks older than his stated age. Alert and in no acute distress.   HEENT: Normal.  Neck: Supple, no JVD, carotid bruits, or masses noted.  Cardiac: Regular rate and rhythm. No murmurs, rubs, or gallops. No edema. His sternum looks great. Vein harvesting of the right lower leg looks ok.  Respiratory:  Lungs are clear to auscultation bilaterally with normal work of breathing.  GI: Soft and nontender.  MS: No deformity or atrophy. Gait and ROM intact.  Skin: Warm and dry. Color is normal.  Neuro:  Strength and sensation are intact and no gross focal deficits noted.  Psych: Alert, appropriate and with normal affect.   LABORATORY DATA:  EKG:  EKG is ordered today.  This demonstrates NSR.  Lab Results  Component Value Date   WBC 7.1 08/09/2017   HGB 11.2 (L) 08/09/2017   HCT 32.7 (L) 08/09/2017   PLT 103 (L) 08/09/2017   GLUCOSE 116 (H) 08/09/2017   CHOL 115 07/10/2017   TRIG 76 07/10/2017   HDL 35 (L) 07/10/2017   LDLCALC 65 07/10/2017   ALT 39 08/02/2017   AST 27 08/02/2017   NA 138 08/09/2017   K 3.7 08/09/2017   CL 102 08/09/2017   CREATININE 1.16 08/09/2017   BUN 13 08/09/2017   CO2 24 08/09/2017   INR 1.23 08/06/2017   HGBA1C 6.6 (H) 08/02/2017     BNP (last 3 results) No  results for input(s): BNP in the last 8760 hours.  ProBNP (last 3 results) No results for input(s): PROBNP in the last 8760 hours.   Other Studies Reviewed Today:  TEE Conclusions 07/2017  Result status: Final result   Septum: Small Patent Foramen Ovale present with left to right shunt visualized by color doppler. PFO closure device present.  LV function depressed globally with EF of 40%, no RWMA       Procedure 08/07/2017   Off-pump Coronary Artery Bypass Grafting x2 Left Internal Mammary Artery to Distal Left Anterior Descending Coronary Artery Sapheonous Vein Graft to Diagonal Branch Coronary Artery OpenVein Harvest from Right Lower Leg     LEFT HEART CATH AND CORONARY ANGIOGRAPHY 07/2017  Conclusion    Ostial to proximal 95% LAD stenosis showing evidence of progression since acute myocardial infarction in November 2018.  Widely patent left main  Widely patent circumflex stent placed during acute myocardial infarction in November 2018.  First obtuse marginal with 85% stenosis.  First obtuse marginal is jailed.  40-50% stenosis distal to stent.  Luminal irregularities in the mid and distal RCA.  Normal left ventricular systolic function.  Ejection fraction is 65%.  LVEDP is normal.  RECOMMENDATIONS:   With ostial LAD and moderate mid vessel LAD disease involving a large diagonal, I  believe surgical revascularization is the patient's best option with arterial conduit to the LAD.  This could perhaps be performed off pump.  The first obtuse marginal is not large enough to bypass.  The major issue now is antiplatelet therapy to avoid stent thrombosis in preparation for bypass surgery.  He is on Brilinta.  Will need to hold Brilinta at least 5 days.  We will get him set to see a surgeon within the next 48 hours to establish a game plan for moving forward.  With a second generation drug-eluting stent (everolimus), it is relatively safe to discontinue Brilinta for 5 days prior to surgery without an excessive risk of acute stent thrombosis.      ASSESSMENT & PLAN:   1. Severe CAD with prior MI - now s/p off pump CABG with LIMA to LAD and SVG to DX. He looks good clinically and is making good progress. No real issues. Will see what options if any are available for him for cardiac rehab. Lab today. Reminded about lifting restrictions, exercise, etc. He is doing well.   2. HLD - on statin therapy - LDL <70.   3. DM - per PCP  4. HTN - BP looks good on current regimen. No changes made today.   5. Prior smoker - not smoking.    Current medicines are reviewed with the patient today.  The patient does not have concerns regarding medicines other than what has been noted above.  The following changes have been made:  See above.  Labs/ tests ordered today include:    Orders Placed This Encounter  Procedures  . Basic metabolic panel  . CBC  . EKG 12-Lead     Disposition:   FU with Dr. Katrinka Blazing as planned for later in March. I will see him in June with fasting labs.    Patient is agreeable to this plan and will call if any problems develop in the interim.   SignedNorma Fredrickson, NP  08/25/2017 9:00 AM  [facility] [address] [place], [place]  [address] Phone: [phone] Fax: [phone]

## 2017-08-25 NOTE — Patient Instructions (Addendum)
We will be checking the following labs today - BMET and CBC   Medication Instructions:    Continue with your current medicines.     Testing/Procedures To Be Arranged:  N/A  Follow-Up:   See Dr. Katrinka Blazing as planned later this month  See me in June with fasting labs    Other Special Instructions:   I will send a note to cardiac rehab to see what options are available to you  Walking - goal 45 minutes a day    If you need a refill on your cardiac medications before your next appointment, please call your pharmacy.   Call the [facility] office at [phone] if you have any questions, problems or concerns.

## 2017-08-26 LAB — CBC
Hematocrit: 38.2 % (ref 37.5–51.0)
Hemoglobin: 13.1 g/dL (ref 13.0–17.7)
MCH: 32 pg (ref 26.6–33.0)
MCHC: 34.3 g/dL (ref 31.5–35.7)
MCV: 93 fL (ref 79–97)
Platelets: 387 10*3/uL — ABNORMAL HIGH (ref 150–379)
RBC: 4.1 x10E6/uL — ABNORMAL LOW (ref 4.14–5.80)
RDW: 13.7 % (ref 12.3–15.4)
WBC: 5.7 10*3/uL (ref 3.4–10.8)

## 2017-08-26 LAB — BASIC METABOLIC PANEL
BUN/Creatinine Ratio: 10 (ref 9–20)
BUN: 11 mg/dL (ref 6–24)
CO2: 26 mmol/L (ref 20–29)
Calcium: 9.6 mg/dL (ref 8.7–10.2)
Chloride: 99 mmol/L (ref 96–106)
Creatinine, Ser: 1.09 mg/dL (ref 0.76–1.27)
GFR calc Af Amer: 94 mL/min/{1.73_m2} (ref >59)
GFR calc non Af Amer: 82 mL/min/{1.73_m2} (ref >59)
Glucose: 123 mg/dL — ABNORMAL HIGH (ref 65–99)
Potassium: 4.3 mmol/L (ref 3.5–5.2)
Sodium: 138 mmol/L (ref 134–144)

## 2017-09-07 ENCOUNTER — Encounter: Payer: Self-pay | Admitting: Thoracic Surgery (Cardiothoracic Vascular Surgery)

## 2017-09-07 ENCOUNTER — Ambulatory Visit
Admission: RE | Admit: 2017-09-07 | Discharge: 2017-09-07 | Disposition: A | Discharge status: Home / Self Care | Payer: No Typology Code available for payment source | Source: Ambulatory Visit | Attending: Thoracic Surgery (Cardiothoracic Vascular Surgery) | Admitting: Thoracic Surgery (Cardiothoracic Vascular Surgery)

## 2017-09-07 ENCOUNTER — Other Ambulatory Visit: Payer: Self-pay | Admitting: Thoracic Surgery (Cardiothoracic Vascular Surgery)

## 2017-09-07 ENCOUNTER — Ambulatory Visit (INDEPENDENT_AMBULATORY_CARE_PROVIDER_SITE_OTHER): Payer: Self-pay | Admitting: Thoracic Surgery (Cardiothoracic Vascular Surgery)

## 2017-09-07 VITALS — BP 140/88 | HR 97 | Resp 20 | Ht 69.5 in | Wt 199.0 lb

## 2017-09-07 DIAGNOSIS — Z951 Presence of aortocoronary bypass graft: Secondary | ICD-10-CM

## 2017-09-07 NOTE — Patient Instructions (Signed)
Continue all previous medications without any changes at this time  Continue to avoid any heavy lifting or strenuous use of your arms or shoulders for at least a total of three months from the time of surgery.  After three months you may gradually increase how much you lift or otherwise use your arms or chest as tolerated, with limits based upon whether or not activities lead to the return of significant discomfort.  You may return to driving an automobile as long as you are no longer requiring oral narcotic pain relievers during the daytime.  It would be wise to start driving only short distances during the daylight and gradually increase from there as you feel comfortable.  You are encouraged to enroll and participate in the outpatient cardiac rehab program beginning as soon as practical.  

## 2017-09-07 NOTE — Progress Notes (Signed)
[address]       [place] [address]             [phone]     CARDIOTHORACIC SURGERY OFFICE NOTE  Referring Provider is Lyn Records, MD PCP is Buckner Malta, MD   HPI:  Patient is a 47 year old male who returns the office today for routine follow-up status post off-pump coronary artery bypass grafting x2 on August 06, 2017 for severe multivessel coronary artery disease with accelerating symptoms of angina pectoris, having previously undergone PCI and stenting of the left circumflex coronary artery in November 2018.  His postoperative recovery in the hospital was uneventful and he was discharged home on the fourth postoperative day.  Since hospital discharge he has done well.  He was seen in follow-up by Norma Fredrickson at [facility] on August 25, 2017 and he returns to our office for routine follow-up today.  He reports that he is doing quite well.  He has had minimal soreness in his chest and he has not taken anything but Tylenol since hospital discharge.  He states that his chest only hurts if he sneezes or coughs, and this is already improved considerably.  His activity level is quite good.  He is now walking at least 45 minutes a day and he denies any problems with any exertional shortness of breath or chest discomfort.  He is sleeping well at night.  Appetite is good.  He is delighted with his progress.   Current Outpatient Medications  Medication Sig Dispense Refill  . acetaminophen (TYLENOL) 500 MG tablet Take 2 tablets (1,000 mg total) by mouth every 6 (six) hours. 30 tablet 0  . aspirin 81 MG chewable tablet Chew 1 tablet (81 mg total) daily by mouth.    Marland Kitchen atorvastatin (LIPITOR) 80 MG tablet Take 1 tablet (80 mg total) daily at 6 PM by mouth. 30 tablet 6  . carvedilol (COREG) 3.125 MG tablet TAKE 1 TABLET (3.125 MG TOTAL) BY MOUTH 2 (TWO) TIMES DAILY WITH A MEAL. 60 tablet 2  . clopidogrel (PLAVIX) 75 MG tablet Take 1 tablet (75 mg total) by mouth  daily. 30 tablet 3  . nitroGLYCERIN (NITROSTAT) 0.4 MG SL tablet Place 1 tablet (0.4 mg total) every 5 (five) minutes as needed under the tongue for chest pain. 25 tablet 12  . oxyCODONE (OXY IR/ROXICODONE) 5 MG immediate release tablet Take 1 tablet (5 mg total) by mouth every 4 (four) hours as needed for severe pain. (Patient not taking: Reported on 09/07/2017) 30 tablet 0   No current facility-administered medications for this visit.       Physical Exam:   BP 140/88   Pulse 97   Resp 20   Ht 5' 9.5" (1.765 m)   Wt 199 lb (90.3 kg)   SpO2 98% Comment: RA  BMI 28.97 kg/m   General:  Well-appearing  Chest:   Clear to auscultation  CV:   Regular rate and rhythm  Incisions:  Healing nicely, sternum is stable  Abdomen:  Soft nontender  Extremities:  Warm and well perfused  Diagnostic Tests:  CHEST - 2 VIEW  COMPARISON:  08/09/2017  FINDINGS: Sequelae of prior CABG are again identified. The cardiac silhouette is normal in size. Lung volumes are increased compared to the prior study, and there is improved aeration of the lung bases. No airspace consolidation, edema, pleural effusion, or pneumothorax is identified. Mild thoracic dextroscoliosis is noted.  IMPRESSION: Improved lung aeration. No  evidence of active cardiopulmonary disease.   Electronically Signed   By: Sebastian Ache M.D.   On: 09/07/2017 10:53   Impression:  Patient is doing very well approximately 1 month status post coronary artery bypass grafting  Plan:  I have encouraged the patient to continue to gradually increase his physical activity as tolerated with his primary limitation remaining that he refrain from heavy lifting or strenuous use of his arms or shoulders for at least another 2 months.  I have encouraged him to enroll and participate in the outpatient cardiac rehab program.  I think he may resume driving an automobile.  We have not recommended any changes to his current  medications.  Patient will return to our office for routine follow-up next February, approximately 1 year following his surgery.  He will call and return sooner should specific problems or questions arise.    Salvatore Decent. Cornelius Moras, MD 09/07/2017 11:09 AM

## 2017-09-08 DIAGNOSIS — I25810 Atherosclerosis of coronary artery bypass graft(s) without angina pectoris: Secondary | ICD-10-CM | POA: Insufficient documentation

## 2017-09-08 NOTE — Progress Notes (Signed)
Cardiology Office Note    Date:  09/09/2017   ID:  Adrian Graham, DOB 22-Dec-1970, MRN [id number]  PCP:  Adrian Malta, MD  Cardiologist: Adrian Noe, MD   Chief Complaint  Patient presents with  . Coronary Artery Disease    History of Present Illness:  Adrian Graham is a 47 y.o. male with history of CAD, acute inferolateral ST elevation myocardial infarction November 2018 treated with DES to the circumflex.  Intermediate stenosis in the ostial LAD was followed clinically until recurrence of angina in February 2019 led to repeat cath and documentation of progression leading to off-pump LIMA to LAD and SVG to diagonal.  Returns today for clinical follow-up.  Other problems include type 2 diabetes and hyperlipidemia.  The patient is doing well.  He saw Dr. Cornelius Graham earlier this week.  Physical activity has been liberalized.  He works in [employer] which requires physical activity including pulling and lifting.  He is walking increasingly without symptoms.  Sternal soreness is improved.  He has had no chills, fever, or drainage from incisions.  No medication side effects.  His goal is to get off of all medication.  We discussed the highly unlikely probability of no medication if our goal is to maintain adequate risk factor modification.  Past Medical History:  Diagnosis Date  . Acute myocardial infarction (HCC) 04/25/2017  . Acute ST elevation myocardial infarction (STEMI) of inferior wall (HCC) 04/25/2017  . Arthritis    "hands" (08/05/2017)  . Coronary artery disease   . Family history of early CAD 04/25/2017  . Hyperlipidemia LDL goal <70 04/25/2017  . Hypertension   . Nasal congestion   . Pre-diabetes   . 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 CARDIAC CATHETERIZATION    . CARPAL TUNNEL RELEASE Left   . CORONARY ARTERY BYPASS GRAFT N/A 08/06/2017   Procedure:  OFF PUMP CORONARY ARTERY BYPASS GRAFTING (CABG) x 2 WITH OPEN HARVESTING OF RIGHT LOWER SAPHENOUS VEIN;  Surgeon: Adrian Nails, MD;  Location: [facility] OR;  Service: Open Heart Surgery;  Laterality: N/A;  . CORONARY/GRAFT ACUTE MI REVASCULARIZATION N/A 04/25/2017   Procedure: Coronary/Graft Acute MI Revascularization;  Surgeon: Adrian Records, MD;  [facility];  Service: Cardiovascular;  Laterality: N/A;  . LEFT HEART CATH AND CORONARY ANGIOGRAPHY N/A 04/25/2017   Procedure: LEFT HEART CATH AND CORONARY ANGIOGRAPHY;  Surgeon: Adrian Records, MD;  [facility]: [facility];  Service: Cardiovascular;  Laterality: N/A;  . LEFT HEART CATH AND CORONARY ANGIOGRAPHY N/A 07/28/2017   Procedure: LEFT HEART CATH AND CORONARY ANGIOGRAPHY;  Surgeon: Adrian Records, MD;  Location: [facility];  Service: Cardiovascular;  Laterality: N/A;  . TEE WITHOUT CARDIOVERSION N/A 08/06/2017   Procedure: TRANSESOPHAGEAL ECHOCARDIOGRAM (TEE);  Surgeon: Adrian Nails, MD;  Location: [facility] OR;  Service: Open Heart Surgery;  Laterality: N/A;  . WISDOM TOOTH EXTRACTION      Current Medications: Outpatient Medications Prior to Visit  Medication Sig Dispense Refill  . acetaminophen (TYLENOL) 500 MG tablet Take 2 tablets (1,000 mg total) by mouth every 6 (six) hours. 30 tablet 0  . aspirin 81 MG chewable tablet Chew 1 tablet (81 mg total) daily by mouth.    Marland Kitchen atorvastatin (LIPITOR) 80 MG tablet Take 1 tablet (80 mg total) daily at 6  PM by mouth. 30 tablet 6  . carvedilol (COREG) 3.125 MG tablet TAKE 1 TABLET (3.125 MG TOTAL) BY MOUTH 2 (TWO) TIMES DAILY WITH A MEAL. 60 tablet 2  . clopidogrel (PLAVIX) 75 MG tablet Take 1 tablet (75 mg total) by mouth daily. 30 tablet 3  . nitroGLYCERIN (NITROSTAT) 0.4 MG SL tablet Place 1 tablet (0.4 mg total) every 5 (five) minutes as needed under the tongue for chest pain. 25 tablet 12  . oxyCODONE (OXY IR/ROXICODONE) 5 MG immediate release tablet Take 1 tablet (5 mg  total) by mouth every 4 (four) hours as needed for severe pain. 30 tablet 0   No facility-administered medications prior to visit.      Allergies:   Metformin and related   Social History   Socioeconomic History  . Marital status: Divorced    Spouse name: None  . Number of children: None  . Years of education: None  . Highest education level: None  Social Needs  . Financial resource strain: None  . Food insecurity - worry: None  . Food insecurity - inability: None  . Transportation needs - medical: None  . Transportation needs - non-medical: None  Occupational History  . None  Tobacco Use  . Smoking status: Former Smoker    Packs/day: 1.00    Years: 10.00    Pack years: 10.00    Types: E-cigarettes, Cigarettes    Last attempt to quit: 05/02/2017    Years since quitting: 0.3  . Smokeless tobacco: Former Neurosurgeon    Types: Chew  Substance and Sexual Activity  . Alcohol use: No    Frequency: Never  . Drug use: No  . Sexual activity: Not Currently  Other Topics Concern  . None  Social History Narrative  . None     Family History:  The patient's family history includes CAD in his brother; CAD (age of onset: 69) in his mother; Cancer in his brother; Heart attack in his brother and brother; Heart attack (age of onset: 58) in his father.   ROS:   Please see the history of present illness.    No complaints.  Inquisitive about when he will be released to return to return to work. All other systems reviewed and are negative.   PHYSICAL EXAM:   VS:  BP 114/82   Pulse 88   Ht 5\' 10"  (1.778 m)   Wt 201 lb 12.8 oz (91.5 kg)   BMI 28.96 kg/m    GEN: Well nourished, well developed, in no acute distress  HEENT: normal  Neck: no JVD, carotid bruits, or masses Cardiac: RRR; no murmurs, rubs, or gallops,no edema.  Right lower extremity vein graft harvest site is unremarkable.  Sternal incision is well-healed. Respiratory:  clear to auscultation bilaterally, normal work of  breathing GI: soft, nontender, nondistended, + BS MS: no deformity or atrophy  Skin: warm and dry, no rash Neuro:  Alert and Oriented x 3, Strength and sensation are intact Psych: euthymic mood, full affect  Wt Readings from Last 3 Encounters:  09/09/17 201 lb 12.8 oz (91.5 kg)  09/07/17 199 lb (90.3 kg)  08/25/17 196 lb 12.8 oz (89.3 kg)      Studies/Labs Reviewed:   EKG:  EKG not repeated  Recent Labs: 08/02/2017: ALT 39 08/07/2017: Magnesium 1.8 08/25/2017: BUN 11; Creatinine, Ser 1.09; Hemoglobin 13.1; Platelets 387; Potassium 4.3; Sodium 138   Lipid Panel    Component Value Date/Time   CHOL 115 07/10/2017 0805   TRIG  76 07/10/2017 0805   HDL 35 (L) 07/10/2017 0805   CHOLHDL 3.3 07/10/2017 0805   CHOLHDL 8.4 04/25/2017 0909   VLDL UNABLE TO CALCULATE IF TRIGLYCERIDE OVER 400 mg/dL 08/65/7846 9629   LDLCALC 65 07/10/2017 0805    Additional studies/ Graham that were reviewed today include:  Most recent laboratory data revealed normal kidney function, platelet count, and hemoglobin.    ASSESSMENT:    1. S/P off-pump CABG x 2   2. Coronary artery disease involving coronary bypass graft of native heart without angina pectoris   3. Hyperlipidemia LDL goal <70   4. Non-insulin dependent type 2 diabetes mellitus (HCC)      PLAN:  In order of problems listed above:  1. No postsurgical complications.  He has been released by surgery and told to follow-up in 1 year. 2. He should remain on aspirin and Plavix as he had a stent placed in the circumflex during acute STEMI in November.  He represented with unstable angina related to the LAD lesion prior to surgery.  He will need dual antiplatelet therapy for at least 12 months from the date of open heart surgery. 3. Target less than 70.  Currently stable and at target on high intensity statin therapy.  When he returns in 6 months we will repeat a liver and lipid panel. 4. He when C target should be less than 7.  I encouraged  increasing aerobic activity.  Diabetes mellitus will be managed by his primary physician, Dr. Buckner Graham.  Increase physical activity.  Targeting mid May 2019 to return to work.  Notify us prior to that time if problems.  Notify us if he needs a release to return to work at that time.  20-month follow-up  Medication Adjustments/Labs and Tests Ordered: Current medicines are reviewed at length with the patient today.  Concerns regarding medicines are outlined above.  Medication changes, Labs and Tests ordered today are listed in the Patient Instructions below. Patient Instructions  Medication Instructions:  Your physician recommends that you continue on your current medications as directed. Please refer to the Current Medication list given to you today.  Labwork: None  Testing/Procedures: None  Follow-Up: Your physician wants you to follow-up in: 6 months with Dr. Katrinka Blazing.  You will receive a reminder letter in the mail two months in advance. If you don't receive a letter, please call our office to schedule the follow-up appointment.   Any Other Special Instructions Will Be Listed Below (If Applicable).     If you need a refill on your cardiac medications before your next appointment, please call your pharmacy.      Signed, Adrian Noe, MD  09/09/2017 11:05 AM    [facility] [address] [place], [place], [place]  [address] Phone: [phone]; Fax: [phone]

## 2017-09-09 ENCOUNTER — Encounter: Payer: Self-pay | Admitting: Interventional Cardiology

## 2017-09-09 ENCOUNTER — Ambulatory Visit (INDEPENDENT_AMBULATORY_CARE_PROVIDER_SITE_OTHER): Payer: Self-pay | Admitting: Interventional Cardiology

## 2017-09-09 VITALS — BP 114/82 | HR 88 | Ht 70.0 in | Wt 201.8 lb

## 2017-09-09 DIAGNOSIS — E785 Hyperlipidemia, unspecified: Secondary | ICD-10-CM

## 2017-09-09 DIAGNOSIS — I25810 Atherosclerosis of coronary artery bypass graft(s) without angina pectoris: Secondary | ICD-10-CM

## 2017-09-09 DIAGNOSIS — Z951 Presence of aortocoronary bypass graft: Secondary | ICD-10-CM

## 2017-09-09 DIAGNOSIS — E119 Type 2 diabetes mellitus without complications: Secondary | ICD-10-CM

## 2017-09-09 NOTE — Patient Instructions (Signed)

## 2017-10-12 ENCOUNTER — Other Ambulatory Visit: Payer: Self-pay | Admitting: Physician Assistant

## 2017-11-12 ENCOUNTER — Other Ambulatory Visit: Payer: Self-pay | Admitting: Nurse Practitioner

## 2017-11-23 ENCOUNTER — Ambulatory Visit: Payer: Self-pay | Admitting: Nurse Practitioner

## 2017-12-08 ENCOUNTER — Other Ambulatory Visit: Payer: Self-pay | Admitting: Interventional Cardiology

## 2017-12-08 MED ORDER — CLOPIDOGREL BISULFATE 75 MG PO TABS: 75.0000 mg | ORAL_TABLET | Freq: Every day | ORAL | 2 refills | Status: DC

## 2017-12-08 NOTE — Telephone Encounter (Signed)
Pt's medication was sent to pt's pharmacy as requested. Confirmation received.  °

## 2018-04-07 NOTE — Progress Notes (Signed)
Cardiology Office Note:    Date:  04/09/2018   ID:  Adrian Graham, DOB 07-09-1970, MRN [id number]  PCP:  Buckner Malta, MD  Cardiologist:  Lesleigh Noe, MD   Referring MD: Buckner Malta, MD   Chief Complaint  Patient presents with  . Coronary Artery Disease    History of Present Illness:    Adrian Graham is a 47 y.o. male with a hx of history of CAD, acute inferolateral ST elevation myocardial infarction November 2018 treated with DES to the circumflex.  Intermediate stenosis in the ostial LAD was followed clinically until recurrence of angina in February 2019 led to repeat cath and documentation of progression leading to off-pump LIMA to LAD and SVG to diagonal.  Returns today for clinical follow-up.  Other problems include type 2 diabetes and hyperlipidemia.  He is doing okay.  He has not had angina.  He denies syncope.  He is aware but will see any discomfort in his chest.  He is worried about his longevity.  He is back at work without any limitations.  He has not had any discomfort similar to what he had prior to coronary bypass grafting.  He underwent surgery primarily because of restenosis in the ostial LAD stent and received LIMA to the LAD and a vein graft to a moderate-sized diagonal.  Recovery has been quite good.  Past Medical History:  Diagnosis Date  . Acute myocardial infarction (HCC) 04/25/2017  . Acute ST elevation myocardial infarction (STEMI) of inferior wall (HCC) 04/25/2017  . Arthritis    "hands" (08/05/2017)  . Coronary artery disease   . Family history of early CAD 04/25/2017  . Hyperlipidemia LDL goal <70 04/25/2017  . Hypertension   . Nasal congestion   . Pre-diabetes   . 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 CARDIAC CATHETERIZATION    . CARPAL TUNNEL RELEASE Left   . CORONARY ARTERY BYPASS GRAFT N/A 08/06/2017   Procedure: OFF PUMP  CORONARY ARTERY BYPASS GRAFTING (CABG) x 2 WITH OPEN HARVESTING OF RIGHT LOWER SAPHENOUS VEIN;  Surgeon: Purcell Nails, MD;  Location[facility] OR;  Service: Open Heart Surgery;  Laterality: N/A;  . CORONARY/GRAFT ACUTE MI REVASCULARIZATION N/A 04/25/2017   Procedure: Coronary/Graft Acute MI Revascularization;  Surgeon: Lyn Records, MD;  [facility]: [facility];  Service: Cardiovascular;  Laterality: N/A;  . LEFT HEART CATH AND CORONARY ANGIOGRAPHY N/A 04/25/2017   Procedure: LEFT HEART CATH AND CORONARY ANGIOGRAPHY;  Surgeon: Lyn Records, MD;  Location: [facility];  Service: Cardiovascular;  Laterality: N/A;  . LEFT HEART CATH AND CORONARY ANGIOGRAPHY N/A 07/28/2017   Procedure: LEFT HEART CATH AND CORONARY ANGIOGRAPHY;  Surgeon: Lyn Records, MD;  [facility];  Service: Cardiovascular;  Laterality: N/A;  . TEE WITHOUT CARDIOVERSION N/A 08/06/2017   Procedure: TRANSESOPHAGEAL ECHOCARDIOGRAM (TEE);  Surgeon: Purcell Nails, MD;  Location: [facility] OR;  Service: Open Heart Surgery;  Laterality: N/A;  . WISDOM TOOTH EXTRACTION      Current Medications: Current Meds  Medication Sig  . acetaminophen (TYLENOL) 500 MG tablet Take 2 tablets (1,000 mg total) by mouth every 6 (six) hours.  Marland Kitchen aspirin 81 MG chewable tablet Chew 1 tablet (81 mg total) daily by mouth.  Marland Kitchen atorvastatin (LIPITOR) 80 MG tablet Take 1 tablet (80 mg total) by  mouth daily at 6 PM.  . nitroGLYCERIN (NITROSTAT) 0.4 MG SL tablet Place 1 tablet (0.4 mg total) every 5 (five) minutes as needed under the tongue for chest pain.  Marland Kitchen oxyCODONE (OXY IR/ROXICODONE) 5 MG immediate release tablet Take 1 tablet (5 mg total) by mouth every 4 (four) hours as needed for severe pain.  . [DISCONTINUED] carvedilol (COREG) 3.125 MG tablet TAKE 1 TABLET BY MOUTH 2 (TWO) TIMES DAILY WITH A MEAL.  . [DISCONTINUED] clopidogrel (PLAVIX) 75 MG tablet Take 1 tablet (75 mg total) by mouth daily.     Allergies:   Metformin and related    Social History   Socioeconomic History  . Marital status: Divorced    Spouse name: Not on file  . Number of children: Not on file  . Years of education: Not on file  . Highest education level: Not on file  Occupational History  . Not on file  Social Needs  . Financial resource strain: Not on file  . Food insecurity:    Worry: Not on file    Inability: Not on file  . Transportation needs:    Medical: Not on file    Non-medical: Not on file  Tobacco Use  . Smoking status: Former Smoker    Packs/day: 1.00    Years: 10.00    Pack years: 10.00    Types: E-cigarettes, Cigarettes    Last attempt to quit: 05/02/2017    Years since quitting: 0.9  . Smokeless tobacco: Former Neurosurgeon    Types: Chew  Substance and Sexual Activity  . Alcohol use: No    Frequency: Never  . Drug use: No  . Sexual activity: Not Currently  Lifestyle  . Physical activity:    Days per week: Not on file    Minutes per session: Not on file  . Stress: Not on file  Relationships  . Social connections:    Talks on phone: Not on file    Gets together: Not on file    Attends religious service: Not on file    Active member of club or organization: Not on file    Attends meetings of clubs or organizations: Not on file    Relationship status: Not on file  Other Topics Concern  . Not on file  Social History Narrative  . Not on file     Family History: The patient's family history includes CAD in his brother; CAD (age of onset: 46) in his mother; Cancer in his brother; Heart attack in his brother and brother; Heart attack (age of onset: 77) in his father.  ROS:   Please see the history of present illness.    Not smoking although he did great for a while after surgery but with the recent concerns he has discontinued that.  He has had at least 30 minutes of walking every day.  All other systems reviewed and are negative.  EKGs/Labs/Other Studies Reviewed:    The following studies were reviewed today: No  new data  EKG:  EKG is not ordered today.  Recent Labs: 08/02/2017: ALT 39 08/07/2017: Magnesium 1.8 08/25/2017: BUN 11; Creatinine, Ser 1.09; Hemoglobin 13.1; Platelets 387; Potassium 4.3; Sodium 138  Recent Lipid Panel    Component Value Date/Time   CHOL 115 07/10/2017 0805   TRIG 76 07/10/2017 0805   HDL 35 (L) 07/10/2017 0805   CHOLHDL 3.3 07/10/2017 0805   CHOLHDL 8.4 04/25/2017 0909   VLDL UNABLE TO CALCULATE IF TRIGLYCERIDE OVER 400 mg/dL 16/03/9603 5409  LDLCALC 65 07/10/2017 0805    Physical Exam:    VS:  BP (!) 132/96   Pulse 82   Ht 5\' 9"  (1.753 m)   Wt 198 lb 6.4 oz (90 kg)   BMI 29.30 kg/m     Wt Readings from Last 3 Encounters:  04/09/18 198 lb 6.4 oz (90 kg)  09/09/17 201 lb 12.8 oz (91.5 kg)  09/07/17 199 lb (90.3 kg)     GEN:  Well nourished, well developed in no acute distress HEENT: Normal NECK: No JVD. LYMPHATICS: No lymphadenopathy CARDIAC: RRR, no murmur, no gallop, no edema. VASCULAR: 2+ bilateral radial and carotid pulses.  No bruits. RESPIRATORY:  Clear to auscultation without rales, wheezing or rhonchi  ABDOMEN: Soft, non-tender, non-distended, No pulsatile mass, MUSCULOSKELETAL: No deformity  SKIN: Warm and dry NEUROLOGIC:  Alert and oriented x 3 PSYCHIATRIC:  Normal affect   ASSESSMENT:    1. Coronary artery disease involving coronary bypass graft of native heart without angina pectoris   2. Hyperlipidemia LDL goal <70   3. Non-insulin dependent type 2 diabetes mellitus (HCC)    PLAN:    In order of problems listed above:  1. Stable from cardiac standpoint.  Not having angina.  I encouraged aerobic activity and aggressive secondary prevention. 2. LDL target less than 70.  When most recently checked by his primary physician, LDL was less than 70.  Continue high intensity atorvastatin and recheck lipid panel and liver panel. 3. Hemoglobin A1c less than 7.  He is at target. 4. Blood pressure is elevated and will be managed by  increasing carvedilol to 6.25 mg p.o. twice daily.  BP target is 130/80 mmHg.  Discontinue Plavix.  Aerobic activity to achieve 150 minutes of moderate activity or greater per week.  BP target 130/80 mmHg or less, LDL target less than 70, A1c less than 7, weight control, decrease carbohydrate diet, no smoking, call if chest pain, clinical follow-up 6 months.   Medication Adjustments/Labs and Tests Ordered: Current medicines are reviewed at length with the patient today.  Concerns regarding medicines are outlined above.  Orders Placed This Encounter  Procedures  . Lipid Profile  . Hepatic function panel  . Basic metabolic panel   Meds ordered this encounter  Medications  . carvedilol (COREG) 6.25 MG tablet    Sig: Take 1 tablet (6.25 mg total) by mouth 2 (two) times daily.    Dispense:  180 tablet    Refill:  3    Dose change    Patient Instructions  Medication Instructions:  1) DISCONTINUE Plavix once you run out of this medication 2) INCREASE Carvedilol to 6.25mg  twice daily  If you need a refill on your cardiac medications before your next appointment, please call your pharmacy.   Lab work: Your physician recommends that you return for lab work when you are fasting (Liver, lipid and BMET)  If you have labs (blood work) drawn today and your tests are completely normal, you will receive your results only by: Marland Kitchen MyChart Message (if you have MyChart) OR . A paper copy in the mail If you have any lab test that is abnormal or we need to change your treatment, we will call you to review the results.  Testing/Procedures: None  Follow-Up: At [facility], you and your health needs are our priority.  As part of our continuing mission to provide you with exceptional heart care, we have created designated Provider Care Teams.  These Care Teams include your primary  Cardiologist (physician) and Advanced Practice Providers (APPs -  Physician Assistants and Nurse Practitioners) who all  work together to provide you with the care you need, when you need it. You will need a follow up appointment in 6 months.  Please call our office 2 months in advance to schedule this appointment.  You may see Lesleigh Noe, MD or one of the following Advanced Practice Providers on your designated Care Team:   Norma Fredrickson, NP Nada Boozer, NP . Georgie Chard, NP  Any Other Special Instructions Will Be Listed Below (If Applicable).       Signed, Lesleigh Noe, MD  04/09/2018 4:36 PM    [facility]

## 2018-04-09 ENCOUNTER — Ambulatory Visit (INDEPENDENT_AMBULATORY_CARE_PROVIDER_SITE_OTHER): Payer: PRIVATE HEALTH INSURANCE | Admitting: Interventional Cardiology

## 2018-04-09 ENCOUNTER — Encounter: Payer: Self-pay | Admitting: Interventional Cardiology

## 2018-04-09 VITALS — BP 132/96 | HR 82 | Ht 69.0 in | Wt 198.4 lb

## 2018-04-09 DIAGNOSIS — E785 Hyperlipidemia, unspecified: Secondary | ICD-10-CM

## 2018-04-09 DIAGNOSIS — E119 Type 2 diabetes mellitus without complications: Secondary | ICD-10-CM

## 2018-04-09 DIAGNOSIS — I25810 Atherosclerosis of coronary artery bypass graft(s) without angina pectoris: Secondary | ICD-10-CM | POA: Diagnosis not present

## 2018-04-09 DIAGNOSIS — I10 Essential (primary) hypertension: Secondary | ICD-10-CM | POA: Diagnosis not present

## 2018-04-09 MED ORDER — CARVEDILOL 6.25 MG PO TABS: 6.2500 mg | ORAL_TABLET | Freq: Two times a day (BID) | ORAL | 3 refills | Status: DC

## 2018-04-09 NOTE — Patient Instructions (Signed)
Medication Instructions:  1) DISCONTINUE Plavix once you run out of this medication 2) INCREASE Carvedilol to 6.25mg  twice daily  If you need a refill on your cardiac medications before your next appointment, please call your pharmacy.   Lab work: Your physician recommends that you return for lab work when you are fasting (Liver, lipid and BMET)  If you have labs (blood work) drawn today and your tests are completely normal, you will receive your results only by: Marland Kitchen MyChart Message (if you have MyChart) OR . A paper copy in the mail If you have any lab test that is abnormal or we need to change your treatment, we will call you to review the results.  Testing/Procedures: None  Follow-Up: At [facility], you and your health needs are our priority.  As part of our continuing mission to provide you with exceptional heart care, we have created designated Provider Care Teams.  These Care Teams include your primary Cardiologist (physician) and Advanced Practice Providers (APPs -  Physician Assistants and Nurse Practitioners) who all work together to provide you with the care you need, when you need it. You will need a follow up appointment in 6 months.  Please call our office 2 months in advance to schedule this appointment.  You may see Lesleigh Noe, MD or one of the following Advanced Practice Providers on your designated Care Team:   Norma Fredrickson, NP Nada Boozer, NP . Georgie Chard, NP  Any Other Special Instructions Will Be Listed Below (If Applicable).

## 2018-04-26 ENCOUNTER — Other Ambulatory Visit: Payer: PRIVATE HEALTH INSURANCE | Admitting: *Deleted

## 2018-04-26 DIAGNOSIS — E785 Hyperlipidemia, unspecified: Secondary | ICD-10-CM

## 2018-04-26 LAB — BASIC METABOLIC PANEL
BUN/Creatinine Ratio: 13 (ref 9–20)
BUN: 15 mg/dL (ref 6–24)
CALCIUM: 9.6 mg/dL (ref 8.7–10.2)
CO2: 24 mmol/L (ref 20–29)
Chloride: 99 mmol/L (ref 96–106)
Creatinine, Ser: 1.2 mg/dL (ref 0.76–1.27)
GFR calc Af Amer: 83 mL/min/{1.73_m2} (ref >59)
GFR, EST NON AFRICAN AMERICAN: 72 mL/min/{1.73_m2} (ref >59)
Glucose: 118 mg/dL — ABNORMAL HIGH (ref 65–99)
Potassium: 4.9 mmol/L (ref 3.5–5.2)
Sodium: 137 mmol/L (ref 134–144)

## 2018-04-26 LAB — LIPID PANEL
CHOL/HDL RATIO: 3.4 ratio (ref 0.0–5.0)
Cholesterol, Total: 115 mg/dL (ref 100–199)
HDL: 34 mg/dL — ABNORMAL LOW (ref >39)
LDL CALC: 59 mg/dL (ref 0–99)
Triglycerides: 109 mg/dL (ref 0–149)
VLDL CHOLESTEROL CAL: 22 mg/dL (ref 5–40)

## 2018-04-26 LAB — HEPATIC FUNCTION PANEL
ALBUMIN: 4.4 g/dL (ref 3.5–5.5)
ALT: 25 IU/L (ref 0–44)
AST: 21 IU/L (ref 0–40)
Alkaline Phosphatase: 77 IU/L (ref 39–117)
Bilirubin Total: 0.7 mg/dL (ref 0.0–1.2)
Bilirubin, Direct: 0.17 mg/dL (ref 0.00–0.40)
Total Protein: 6.5 g/dL (ref 6.0–8.5)

## 2018-06-07 ENCOUNTER — Encounter: Payer: Self-pay | Admitting: Thoracic Surgery (Cardiothoracic Vascular Surgery)

## 2018-08-09 ENCOUNTER — Encounter: Payer: Self-pay | Admitting: Thoracic Surgery (Cardiothoracic Vascular Surgery)

## 2018-08-16 ENCOUNTER — Other Ambulatory Visit: Payer: Self-pay

## 2018-08-16 ENCOUNTER — Encounter: Payer: Self-pay | Admitting: Thoracic Surgery (Cardiothoracic Vascular Surgery)

## 2018-08-16 ENCOUNTER — Ambulatory Visit: Payer: PRIVATE HEALTH INSURANCE | Admitting: Thoracic Surgery (Cardiothoracic Vascular Surgery)

## 2018-08-16 VITALS — BP 128/89 | HR 60 | Resp 16 | Ht 69.0 in | Wt 198.0 lb

## 2018-08-16 DIAGNOSIS — Z951 Presence of aortocoronary bypass graft: Secondary | ICD-10-CM

## 2018-08-16 NOTE — Patient Instructions (Signed)
Continue all previous medications without any changes at this time  Make every effort to stay physically active, get some type of exercise on a regular basis, and stick to a "heart healthy diet".  The long term benefits for regular exercise and a healthy diet are critically important to your overall health and wellbeing.  Make every effort to keep your diabetes under very tight control.  Follow up closely with your primary care physician or endocrinologist and strive to keep their hemoglobin A1c levels as low as possible, preferably near or below 6.0.  The long term benefits of strict control of diabetes are far reaching and critically important for your overall health and survival.  

## 2018-08-16 NOTE — Progress Notes (Signed)
[address]       [place] [address]             [phone]     CARDIOTHORACIC SURGERY OFFICE NOTE  Primary Cardiologist is Lesleigh Noe, MD PCP is Buckner Malta, MD   HPI:  Patient is a 48 year old male who returns the office today for routine follow-up status post off-pump coronary artery bypass grafting x2 on August 06, 2017 for severe multivessel coronary artery disease with accelerating symptoms of angina pectoris, having previously undergone PCI and stenting of the left circumflex coronary artery in November 2018.  Patient's early recovery was uneventful and he was last seen here in our office on September 07, 2017 at which time he was doing well.  Since then he has been seen in follow-up by Dr. Katrinka Blazing, most recently on April 09, 2018.  He returns to the office today and reports that he feels quite well.  Overall he feels "much improved" in comparison with how he felt prior to surgery.  He denies any symptoms of exertional shortness of breath or chest discomfort.  He has had mild area of numbness along the left lower costal margin that has persisted ever since his surgery but otherwise he has no residual pain in his chest.  Overall he is delighted with his progress and he reports no physical limitations whatsoever.   Current Outpatient Medications  Medication Sig Dispense Refill  . acetaminophen (TYLENOL) 500 MG tablet Take 2 tablets (1,000 mg total) by mouth every 6 (six) hours. 30 tablet 0  . aspirin 81 MG chewable tablet Chew 1 tablet (81 mg total) daily by mouth.    Marland Kitchen atorvastatin (LIPITOR) 80 MG tablet Take 1 tablet (80 mg total) by mouth daily at 6 PM. 90 tablet 3  . carvedilol (COREG) 6.25 MG tablet Take 1 tablet (6.25 mg total) by mouth 2 (two) times daily. 180 tablet 3  . nitroGLYCERIN (NITROSTAT) 0.4 MG SL tablet Place 1 tablet (0.4 mg total) every 5 (five) minutes as needed under the tongue for chest pain. (Patient not taking: Reported on  08/16/2018) 25 tablet 12   No current facility-administered medications for this visit.       Physical Exam:   BP 128/89 (BP Location: Right Arm, Patient Position: Sitting, Cuff Size: Large)   Pulse 60   Resp 16   Ht 5\' 9"  (1.753 m)   Wt 198 lb (89.8 kg)   SpO2 98% Comment: ON RA  BMI 29.24 kg/m   General:  Well-appearing  Chest:   Clear to auscultation  CV:   Regular rate and rhythm without murmur  Incisions:  Completely healed, sternum is stable  Abdomen:  Soft nontender  Extremities:  Warm and well-perfused  Diagnostic Tests:  n/a   Impression:  Patient is doing very well approximately 1 year status post coronary artery bypass grafting  Plan:  We have not recommended any change to the patient's current medications.  I have encouraged the patient to continue to exercise on a regular basis and make every effort to adhere to a heart healthy diet.  We discussed glycemic control and dietary management of borderline diabetes.  All of his questions have been addressed.  In the future he will call and return to see [place] only should further problems or questions arise.  I spent in excess of 10 minutes during the conduct of this office consultation and >50% of this time involved direct face-to-face encounter with  the patient for counseling and/or coordination of their care.    Salvatore Decent. Cornelius Moras, MD 08/16/2018 11:10 AM

## 2018-08-19 ENCOUNTER — Other Ambulatory Visit: Payer: Self-pay | Admitting: Interventional Cardiology

## 2018-10-25 ENCOUNTER — Telehealth: Payer: Self-pay | Admitting: *Deleted

## 2018-10-25 NOTE — Telephone Encounter (Signed)
TELEPHONE CALL NOTE  This patient has been deemed a candidate for follow-up tele-health visit to limit community exposure during the Covid-19 pandemic. I spoke with the patient via phone to discuss instructions. This has been outlined on the patient's AVS (dotphrase: hcevisitinfo). The patient was advised to review the section on consent for treatment as well. The patient will receive a phone call 2-3 days prior to their E-Visit at which time consent will be verbally confirmed.   A Virtual Office Visit appointment type has been scheduled for VIRTUAL with Adrian Chard, NP, with "VIDEO" or "TELEPHONE" in the appointment notes - patient prefers VIDEO type.  I have either confirmed the patient is active in MyChart or offered to send sign-up link to phone/email via Mychart icon beside patient's photo. Pt declined mychart and gave verbal consent for virtual visit.  YOUR CARDIOLOGY TEAM HAS ARRANGED FOR AN E-VISIT FOR YOUR APPOINTMENT - PLEASE REVIEW IMPORTANT INFORMATION BELOW SEVERAL DAYS PRIOR TO YOUR APPOINTMENT  Due to the recent COVID-19 pandemic, we are transitioning in-person office visits to tele-medicine visits in an effort to decrease unnecessary exposure to our patients, their families, and staff. These visits are billed to your insurance just like a normal visit is. We also encourage you to sign up for MyChart if you have not already done so. You will need a smartphone if possible. For patients that do not have this, we can still complete the visit using a regular telephone but do prefer a smartphone to enable video when possible. You may have a family member that lives with you that can help. If possible, we also ask that you have a blood pressure cuff and scale at home to measure your blood pressure, heart rate and weight prior to your scheduled appointment. Patients with clinical needs that need an in-person evaluation and testing will still be able to come to the office if absolutely  necessary. If you have any questions, feel free to call our office.     YOUR PROVIDER WILL BE USING THE FOLLOWING PLATFORM TO COMPLETE YOUR VISIT: [email or web address]  . IF USING MYCHART - How to Download the MyChart App to Your SmartPhone   - If Apple, go to [employer] and type in MyChart in the search bar and download the app. If Android, ask patient to go to [employer] and type in [place] in the search bar and download the app. The app is free but as with any other app downloads, your phone may require you to verify saved payment information or Apple/Android password.  - You will need to then log into the app with your MyChart username and password, and select [place] as your healthcare provider to link the account.  - When it is time for your visit, go to the MyChart app, find appointments, and click Begin Video Visit. Be sure to Select Allow for your device to access the Microphone and Camera for your visit. You will then be connected, and your provider will be with you shortly.  **If you have any issues connecting or need assistance, please contact MyChart service desk [phone])**  **If using a computer, in order to ensure the best quality for your visit, you will need to use either of the following Internet Browsers: [employer] or [employer]**  . IF USING DOXIMITY or [email or web address].ME - The staff will give you instructions on receiving your link to join the meeting the day of your visit.      2-3 DAYS  BEFORE YOUR APPOINTMENT  You will receive a telephone call from one of our [facility] team members - your caller ID may say "Unknown caller." If this is a video visit, we will walk you through how to get the video launched on your phone. We will remind you check your blood pressure, heart rate and weight prior to your scheduled appointment. If you have an Apple Watch or Kardia, please upload any pertinent ECG strips the day before or morning of your appointment to MyChart. Our  staff will also make sure you have reviewed the consent and agree to move forward with your scheduled tele-health visit.     THE DAY OF YOUR APPOINTMENT  Approximately 15 minutes prior to your scheduled appointment, you will receive a telephone call from one of HeartCare team - your caller ID may say "Unknown caller."  Our staff will confirm medications, vital signs for the day and any symptoms you may be experiencing. Please have this information available prior to the time of visit start. It may also be helpful for you to have a pad of paper and pen handy for any instructions given during your visit. They will also walk you through joining the smartphone meeting if this is a video visit.    CONSENT FOR TELE-HEALTH VISIT - PLEASE REVIEW  I hereby voluntarily request, consent and authorize [facility] and its employed or contracted physicians, physician assistants, nurse practitioners or other licensed health care professionals (the Practitioner), to provide me with telemedicine health care services (the "Services") as deemed necessary by the treating Practitioner. I acknowledge and consent to receive the Services by the Practitioner via telemedicine. I understand that the telemedicine visit will involve communicating with the Practitioner through live audiovisual communication technology and the disclosure of certain medical information by electronic transmission. I acknowledge that I have been given the opportunity to request an in-person assessment or other available alternative prior to the telemedicine visit and am voluntarily participating in the telemedicine visit.  I understand that I have the right to withhold or withdraw my consent to the use of telemedicine in the course of my care at any time, without affecting my right to future care or treatment, and that the Practitioner or I may terminate the telemedicine visit at any time. I understand that I have the right to inspect all  information obtained and/or recorded in the course of the telemedicine visit and may receive copies of available information for a reasonable fee.  I understand that some of the potential risks of receiving the Services via telemedicine include:  Marland Kitchen Delay or interruption in medical evaluation due to technological equipment failure or disruption; . Information transmitted may not be sufficient (e.g. poor resolution of images) to allow for appropriate medical decision making by the Practitioner; and/or  . In rare instances, security protocols could fail, causing a breach of personal health information.  Furthermore, I acknowledge that it is my responsibility to provide information about my medical history, conditions and care that is complete and accurate to the best of my ability. I acknowledge that Practitioner's advice, recommendations, and/or decision may be based on factors not within their control, such as incomplete or inaccurate data provided by me or distortions of diagnostic images or specimens that may result from electronic transmissions. I understand that the practice of medicine is not an exact science and that Practitioner makes no warranties or guarantees regarding treatment outcomes. I acknowledge that I will receive a copy of this consent concurrently upon execution  via email to the email address I last provided but may also request a printed copy by calling the office of [facility].    I understand that my insurance will be billed for this visit.   I have read or had this consent read to me. . I understand the contents of this consent, which adequately explains the benefits and risks of the Services being provided via telemedicine.  . I have been provided ample opportunity to ask questions regarding this consent and the Services and have had my questions answered to my satisfaction. . I give my informed consent for the services to be provided through the use of telemedicine in my  medical care  By participating in this telemedicine visit I agree to the above.    Elliot Cousin, RMA 10/25/2018 4:46 PM

## 2018-10-26 ENCOUNTER — Telehealth: Payer: Self-pay | Admitting: Cardiology

## 2018-10-26 NOTE — Telephone Encounter (Signed)
Spoke with patient who confirmed all demographics. Patient has smart phone and was able to log into My Chart. Will have vitals ready for visit.

## 2018-11-01 NOTE — Progress Notes (Signed)
Virtual Visit via Telephone Note   This visit type was conducted due to national recommendations for restrictions regarding the COVID-19 Pandemic (e.g. social distancing) in an effort to limit this patient's exposure and mitigate transmission in our community.  Due to his co-morbid illnesses, this patient is at least at moderate risk for complications without adequate follow up.  This format is felt to be most appropriate for this patient at this time.  The patient did not have access to video technology/had technical difficulties with video requiring transitioning to audio format only (telephone).  All issues noted in this document were discussed and addressed.  No physical exam could be performed with this format.  Please refer to the patient's chart for his  consent to telehealth for [facility]   Date:  11/02/2018   ID:  Adrian Graham, DOB [birth year], MRN [id number]  Patient Location: Home Provider Location: Home  PCP:  Buckner Malta, MD  Cardiologist:  Lesleigh Noe, MD   Evaluation Performed:  Follow-Up Visit  Chief Complaint: Follow-up of CAD, seen for Dr. Katrinka Blazing  History of Present Illness:    Adrian Graham is a 48 y.o. male with a history of CAD s/p STEMI 2018 with PCI to LCx with recurrent angina 07/2017>>>CABG (LIMA to LAD, SVG to diag), DM2 and hyperlipidemia.   [name] was seen 07/2017 with recurrent chest pain (after STEMI 2018 with PCI to LCx) and was referred for repeat cardiac catheterization with subsequent CABG per Dr. Cornelius Moras.  Postoperative course was unremarkable.  TEE performed at the time of surgery showed small PFO with left-to-right shunt and PFO closure device present? There is no mention of this in the operative note.   He was last seen his primary cardiologist 04/09/2018 and was doing well from a cardiac perspective with no specific complaints.  Today, I am seeing Adrian Graham via virtual visit.  He is doing well today.  He does have some  complaints of mild, intermittent palpitations that have now been occurring since prior to surgery.  He states that occasionally he will feel his heart "skip " lasting only a brief period of time before returning to normal.  He reports having these episodes about once a week and cannot pinpoint exacerbating or relieving factors.  He has no shortness of breath, dizziness, chest pain, orthopnea, LE swelling, PND or syncope.  He has not needed his SL NTG and reports no anginal symptoms however will occasionally have mid left sided rib pain that is very positional. He has associated this sensation with his chest tube placement in the postoperative setting and believes this is likely scar tissue.  He reports this is not similar to the anginal symptoms prior to his MI and he has no associated symptoms.  His blood pressure is well controlled today and he reports medication compliance.  He states that now that the weather is nicer, he can start walking outside once again for exercise.  We discussed bringing him in to the office for lab work to assess assess TSH, electrolytes given complaints of palpitations however he is reluctant given COVID-19.  He did agree to come in in 1 to 2 months for lab work.  In the meantime he is to monitor the frequency, duration and associated symptoms with his palpitations.  If his symptoms change, the next step would be to place a ZIO for 2 weeks.   The patient does not have symptoms concerning for COVID-19 infection (fever, chills, cough, or new  shortness of breath).   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    LIMA  to LAD SVG to DIAGONAL Open vein harvest RLL   Past Surgical History:  Procedure Laterality Date   CARDIAC CATHETERIZATION     CARPAL TUNNEL RELEASE Left    CORONARY ARTERY BYPASS GRAFT N/A 08/06/2017   Procedure: OFF PUMP CORONARY ARTERY BYPASS GRAFTING (CABG) x 2 WITH OPEN HARVESTING OF RIGHT LOWER SAPHENOUS VEIN;  Surgeon: Purcell Nailswen, Clarence H, MD;  Location: [facility] OR;  Service: Open Heart Surgery;  Laterality: N/A;   CORONARY/GRAFT ACUTE MI REVASCULARIZATION N/A 04/25/2017   Procedure: Coronary/Graft Acute MI Revascularization;  Surgeon: Lyn RecordsSmith, Henry W, MD;  Location: [facility] INVASIVE CV LAB;  Service: Cardiovascular;  Laterality: N/A;   LEFT HEART CATH AND CORONARY ANGIOGRAPHY N/A 04/25/2017   Procedure: LEFT HEART CATH AND CORONARY ANGIOGRAPHY;  Surgeon: Lyn RecordsSmith, Henry W, MD;  Location: [facility] INVASIVE CV LAB;  Service: Cardiovascular;  Laterality: N/A;   LEFT HEART CATH AND CORONARY ANGIOGRAPHY N/A 07/28/2017   Procedure: LEFT HEART CATH AND CORONARY ANGIOGRAPHY;  Surgeon: Lyn RecordsSmith, Henry W, MD;  Location: [facility] INVASIVE CV LAB;  Service: Cardiovascular;  Laterality: N/A;   TEE WITHOUT CARDIOVERSION N/A 08/06/2017   Procedure: TRANSESOPHAGEAL ECHOCARDIOGRAM (TEE);  Surgeon: Purcell Nailswen, Clarence H, MD;  [facility];  Service: Open Heart Surgery;  Laterality: N/A;   WISDOM TOOTH EXTRACTION      Current Meds  Medication Sig   acetaminophen (TYLENOL) 500 MG tablet Take 2 tablets (1,000 mg total) by mouth every 6 (six) hours.   aspirin 81 MG chewable tablet Chew 1 tablet (81 mg total) daily by mouth.   atorvastatin (LIPITOR) 80 MG tablet TAKE 1 TABLET BY MOUTH DAILY AT 6 PM.   carvedilol (COREG) 6.25 MG tablet Take 1 tablet (6.25 mg total) by mouth 2 (two) times daily.   nitroGLYCERIN (NITROSTAT) 0.4 MG SL tablet Place 1 tablet (0.4 mg total) every 5 (five) minutes as needed under the tongue for chest pain.    Allergies:   Metformin and related   Social History   Tobacco Use   Smoking status: Former  Smoker    Packs/day: 1.00    Years: 10.00    Pack years: 10.00    Types: E-cigarettes, Cigarettes    Last attempt to quit: 05/02/2017    Years since quitting: 1.5   Smokeless tobacco: Former NeurosurgeonUser    Types: Chew  Substance Use Topics   Alcohol use: No    Frequency: Never   Drug use: No    Family Hx: The patient's family history includes CAD in his brother; CAD (age of onset: 4375) in his mother; Cancer in his brother; Heart attack in his brother and brother; Heart attack (age of onset: 8243) in his father.  ROS:   Please see the history of present illness.     All other systems reviewed and are negative.  Prior CV studies:   The following studies were reviewed today:  Cardiac catheterization 07/28/2017:  Ostial to proximal 95% LAD stenosis showing evidence of progression since acute myocardial infarction in November  2018.  Widely patent left main  Widely patent circumflex stent placed during acute myocardial infarction in November 2018.  First obtuse marginal with 85% stenosis.  First obtuse marginal is jailed.  40-50% stenosis distal to stent.  Luminal irregularities in the mid and distal RCA.  Normal left ventricular systolic function.  Ejection fraction is 65%.  LVEDP is normal.  RECOMMENDATIONS:   With ostial LAD and moderate mid vessel LAD disease involving a large diagonal, I believe surgical revascularization is the patient's best option with arterial conduit to the LAD.  This could perhaps be performed off pump.  The first obtuse marginal is not large enough to bypass.  The major issue now is antiplatelet therapy to avoid stent thrombosis in preparation for bypass surgery.  He is on Brilinta.  Will need to hold Brilinta at least 5 days.  We will get him set to see a surgeon within the next 48 hours to establish a game plan for moving forward.  With a second generation drug-eluting stent (everolimus), it is relatively safe to discontinue Brilinta for 5 days prior to  surgery without an excessive risk of acute stent thrombosis.  Labs/Other Tests and Data Reviewed:    EKG:  An ECG dated 08/25/2017 was personally reviewed today and demonstrated:  NSR  Recent Labs: 04/26/2018: ALT 25; BUN 15; Creatinine, Ser 1.20; Potassium 4.9; Sodium 137   Recent Lipid Panel Lab Results  Component Value Date/Time   CHOL 115 04/26/2018 09:41 AM   TRIG 109 04/26/2018 09:41 AM   HDL 34 (L) 04/26/2018 09:41 AM   CHOLHDL 3.4 04/26/2018 09:41 AM   CHOLHDL 8.4 04/25/2017 09:09 AM   LDLCALC 59 04/26/2018 09:41 AM    Wt Readings from Last 3 Encounters:  11/02/18 187 lb (84.8 kg)  08/16/18 198 lb (89.8 kg)  04/09/18 198 lb 6.4 oz (90 kg)    Objective:    Vital Signs:  BP 129/88    Pulse 72    Ht 5\' 9"  (1.753 m)    Wt 187 lb (84.8 kg)    BMI 27.62 kg/m    VITAL SIGNS:  reviewed GEN:  no acute distress EYES:  sclerae anicteric, EOMI - Extraocular Movements Intact RESPIRATORY:  normal respiratory effort, symmetric expansion CARDIOVASCULAR:  no peripheral edema SKIN:  no rash, lesions or ulcers. MUSCULOSKELETAL:  no obvious deformities. NEURO:  alert and oriented x 3, no obvious focal deficit PSYCH:  normal affect  ASSESSMENT & PLAN:    1.  CAD s/p CABG 2019: -s/p LIMA to LAD and SVG to DX -Stable, no anginal complaints -Plavix discontinued at last office visit 03/2018 -Continue ASA 81, high intensity statin, carvedilol -Has SL NTG, PRN without need for use  2.  Hyperlipidemia: -LDL, 59 on 04/26/2018; at goal of LDL<70 -Continue high intensity atorvastatin -Repeat lipid and LFTs at next office visit, stable 04/2018  3.  DM2: -Last hemoglobin A1c, 6.6 on 08/02/2016 -Encouraged low carbohydrate, low sugar diet with increased aerobic activity with at least 30 minutes/day / 4 days/week -Reports he will began his walking regimen now that the weather is nicer  4.  Hypertension: -Stable, 129/88 -Carvedilol increased to 6.25 mg twice daily at last office visit  with a target BP of 130/80 mmHg or less  5.  Palpitations: -Patient reports mild, intermittent palpitations lasting for a brief duration (1-2 seconds) that have been occuring since prior to his surgery>>likely PVCs -He has no associated symptoms such as SOB, dizziness, chest pain  -Offered lab work  to assess TSH, electrolyte status however patient declined secondary to COVID-19 -Agreed to come in for lab work in 1 to 2 months -In the meantime he is to monitor for increased palpitations, associated symptoms.  If no improvement, consider ZIO patch for more definitive evaluation however based on his description, appears to be PVCs   COVID-19 Education: The signs and symptoms of COVID-19 were discussed with the patient and how to seek care for testing (follow up with PCP or arrange E-visit).  The importance of social distancing was discussed today.  Time:   Today, I have spent 20 minutes with the patient with telehealth technology discussing the above problems.     Medication Adjustments/Labs and Tests Ordered: Current medicines are reviewed at length with the patient today.  Concerns regarding medicines are outlined above.   Tests Ordered: Orders Placed This Encounter  Procedures   Basic metabolic panel   Lipid panel   TSH   Hepatic function panel   Magnesium    Medication Changes: No orders of the defined types were placed in this encounter.   Disposition:  Follow up Dr. Katrinka Blazing in 6 months  Signed, Georgie Chard, NP  11/02/2018 12:30 PM    [facility]

## 2018-11-02 ENCOUNTER — Other Ambulatory Visit: Payer: Self-pay

## 2018-11-02 ENCOUNTER — Encounter: Payer: Self-pay | Admitting: Cardiology

## 2018-11-02 ENCOUNTER — Telehealth (INDEPENDENT_AMBULATORY_CARE_PROVIDER_SITE_OTHER): Payer: PRIVATE HEALTH INSURANCE | Admitting: Cardiology

## 2018-11-02 VITALS — BP 129/88 | HR 72 | Ht 69.0 in | Wt 187.0 lb

## 2018-11-02 DIAGNOSIS — I25810 Atherosclerosis of coronary artery bypass graft(s) without angina pectoris: Secondary | ICD-10-CM | POA: Diagnosis not present

## 2018-11-02 DIAGNOSIS — E785 Hyperlipidemia, unspecified: Secondary | ICD-10-CM | POA: Diagnosis not present

## 2018-11-02 DIAGNOSIS — Z951 Presence of aortocoronary bypass graft: Secondary | ICD-10-CM

## 2018-11-02 DIAGNOSIS — Z955 Presence of coronary angioplasty implant and graft: Secondary | ICD-10-CM

## 2018-11-02 DIAGNOSIS — R002 Palpitations: Secondary | ICD-10-CM

## 2018-11-02 DIAGNOSIS — I10 Essential (primary) hypertension: Secondary | ICD-10-CM | POA: Diagnosis not present

## 2018-11-02 DIAGNOSIS — E119 Type 2 diabetes mellitus without complications: Secondary | ICD-10-CM | POA: Diagnosis not present

## 2018-11-02 DIAGNOSIS — Z8249 Family history of ischemic heart disease and other diseases of the circulatory system: Secondary | ICD-10-CM

## 2018-11-02 NOTE — Patient Instructions (Signed)
Medication Instructions:  Your physician recommends that you continue on your current medications as directed. Please refer to the Current Medication list given to you today.  If you need a refill on your cardiac medications before your next appointment, please call your pharmacy.   Lab work: FUTURE: TSH, BMET & MAGNESIUM ON 12/13/2018  Your physician recommends that you return for a FASTING lipid profile and hepatic function test in 6 months   If you have labs (blood work) drawn today and your tests are completely normal, you will receive your results only by: Marland Kitchen MyChart Message (if you have MyChart) OR . A paper copy in the mail If you have any lab test that is abnormal or we need to change your treatment, we will call you to review the results.  Testing/Procedures: None   Follow-Up: At [facility], you and your health needs are our priority.  As part of our continuing mission to provide you with exceptional heart care, we have created designated Provider Care Teams.  These Care Teams include your primary Cardiologist (physician) and Advanced Practice Providers (APPs -  Physician Assistants and Nurse Practitioners) who all work together to provide you with the care you need, when you need it. You will need a follow up appointment in 6 months.  Please call our office 2 months in advance to schedule this appointment.  You may see Lesleigh Noe, MD or one of the following Advanced Practice Providers on your designated Care Team:   Norma Fredrickson, NP Nada Boozer, NP . Georgie Chard, NP  Any Other Special Instructions Will Be Listed Below (If Applicable).

## 2018-11-19 ENCOUNTER — Other Ambulatory Visit: Payer: Self-pay | Admitting: Interventional Cardiology

## 2018-12-03 ENCOUNTER — Telehealth: Payer: Self-pay | Admitting: Interventional Cardiology

## 2018-12-03 NOTE — Telephone Encounter (Signed)
For the last 1.5-2 months pt has been dealing with insomnia.  Also states that about 2 months ago GBS were normal, then he lost his machine and recently purchased a new one.  New machine has been showing consistently high blood sugars.  Pt states for some time he has been dealing with lightheadedness when he changes positions but it seems to have worsened slightly since doing telehealth visit with Kathyrn Drown, NP in May.  Hasn't checked BP recently but last reading was 116/70s. States since his heart attack and the start of these medications he has felt some "stuffiness" in his nose and throat that makes him hoarse at times.  Denies any other cardiac sx.  States at the beginning of the pandemic he lost a fair amount of weight because he wasn't eating much due to anxiety about the virus.  Recently gained back 5lbs.  Pt read up on side effects of his medications and was concerned Atorvastatin or Coreg could be causing some of these issues.  Advised I will send to Dr. Tamala Julian and our Pharmacy team to see if any correlation or if they have any recommendations.

## 2018-12-03 NOTE — Telephone Encounter (Signed)
Atorvastatin and carvedilol can both slightly increase blood sugar, however he has been prescribed these medications since the end of 2018. If he did not notice a change in his blood sugar until 1-2 months ago, this should not be strongly correlated. Might be related to recent weight gain pt mentioned. Carvedilol also has a low incidence of causing insomnia (1-2%), but again initiation of therapy does not really correlate with his symptom onset. None of his meds should cause stuffiness.

## 2018-12-03 NOTE — Telephone Encounter (Signed)
Spoke with pt and made him aware of information provided. He will continue to monitor and if no improvement, will f/u with PCP.

## 2018-12-03 NOTE — Telephone Encounter (Signed)
Complaints are vague. I can't tell much. Is his corn such that meds should be stopped?

## 2018-12-03 NOTE — Telephone Encounter (Signed)
New Message   Pt c/o medication issue:  1. Name of Medication: atorvastatin (LIPITOR) 80 MG tablet   2. How are you currently taking this medication (dosage and times per day)?   3. Are you having a reaction (difficulty breathing--STAT)?  4. What is your medication issue? Patient states that he has been experiencing insomnia, blood sugar and sometime he may feel light headed sometimes

## 2018-12-10 ENCOUNTER — Telehealth: Payer: Self-pay | Admitting: *Deleted

## 2018-12-10 NOTE — Telephone Encounter (Signed)
    COVID-19 Pre-Screening Questions:  . In the past 7 to 10 days have you had a cough,  shortness of breath, headache, congestion, fever (100 or greater) body aches, chills, sore throat, or sudden loss of taste or sense of smell? . Have you been around anyone with known Covid 19. . Have you been around anyone who is awaiting Covid 19 test results in the past 7 to 10 days? . Have you been around anyone who has been exposed to Covid 19, or has mentioned symptoms of Covid 19 within the past 7 to 10 days?  If you have any concerns/questions about symptoms patients report during screening (either on the phone or at threshold). Contact the provider seeing the patient or DOD for further guidance.  If neither are available contact a member of the leadership team.           Contacted patient via telephone call. All no to Covid 19 questions . Has a mask.KB 

## 2018-12-13 ENCOUNTER — Other Ambulatory Visit: Payer: PRIVATE HEALTH INSURANCE

## 2019-03-14 ENCOUNTER — Telehealth: Payer: Self-pay

## 2019-03-14 DIAGNOSIS — E785 Hyperlipidemia, unspecified: Secondary | ICD-10-CM

## 2019-03-14 DIAGNOSIS — Z951 Presence of aortocoronary bypass graft: Secondary | ICD-10-CM

## 2019-03-14 DIAGNOSIS — Z79899 Other long term (current) drug therapy: Secondary | ICD-10-CM

## 2019-03-14 DIAGNOSIS — Z8249 Family history of ischemic heart disease and other diseases of the circulatory system: Secondary | ICD-10-CM

## 2019-03-14 DIAGNOSIS — I25810 Atherosclerosis of coronary artery bypass graft(s) without angina pectoris: Secondary | ICD-10-CM

## 2019-03-14 NOTE — Telephone Encounter (Signed)
-----   Message from Loren Racer, LPN sent at 93/12/3426  9:22 AM EST ----- Needs lipid, liver, BMET in late Oct/early Nov

## 2019-03-14 NOTE — Telephone Encounter (Signed)
Pt to come in 04/20/19 for fasting labs.. orders placed in Epic.

## 2019-04-15 ENCOUNTER — Other Ambulatory Visit: Payer: Self-pay | Admitting: Interventional Cardiology

## 2019-04-20 ENCOUNTER — Other Ambulatory Visit: Payer: PRIVATE HEALTH INSURANCE

## 2019-04-22 ENCOUNTER — Other Ambulatory Visit: Payer: Self-pay

## 2019-04-22 ENCOUNTER — Other Ambulatory Visit: Payer: PRIVATE HEALTH INSURANCE | Admitting: *Deleted

## 2019-04-22 DIAGNOSIS — I25810 Atherosclerosis of coronary artery bypass graft(s) without angina pectoris: Secondary | ICD-10-CM

## 2019-04-22 DIAGNOSIS — Z951 Presence of aortocoronary bypass graft: Secondary | ICD-10-CM

## 2019-04-22 DIAGNOSIS — E785 Hyperlipidemia, unspecified: Secondary | ICD-10-CM

## 2019-04-22 DIAGNOSIS — Z79899 Other long term (current) drug therapy: Secondary | ICD-10-CM

## 2019-04-22 DIAGNOSIS — Z8249 Family history of ischemic heart disease and other diseases of the circulatory system: Secondary | ICD-10-CM

## 2019-04-22 LAB — BASIC METABOLIC PANEL
BUN/Creatinine Ratio: 16 (ref 9–20)
BUN: 20 mg/dL (ref 6–24)
CO2: 20 mmol/L (ref 20–29)
Calcium: 9 mg/dL (ref 8.7–10.2)
Chloride: 104 mmol/L (ref 96–106)
Creatinine, Ser: 1.25 mg/dL (ref 0.76–1.27)
GFR calc Af Amer: 79 mL/min/{1.73_m2} (ref >59)
GFR calc non Af Amer: 68 mL/min/{1.73_m2} (ref >59)
Glucose: 132 mg/dL — ABNORMAL HIGH (ref 65–99)
Potassium: 4.6 mmol/L (ref 3.5–5.2)
Sodium: 139 mmol/L (ref 134–144)

## 2019-04-22 LAB — LIPID PANEL
Chol/HDL Ratio: 3.3 ratio (ref 0.0–5.0)
Cholesterol, Total: 122 mg/dL (ref 100–199)
HDL: 37 mg/dL — ABNORMAL LOW (ref >39)
LDL Chol Calc (NIH): 69 mg/dL (ref 0–99)
Triglycerides: 78 mg/dL (ref 0–149)
VLDL Cholesterol Cal: 16 mg/dL (ref 5–40)

## 2019-04-22 LAB — HEPATIC FUNCTION PANEL
ALT: 20 IU/L (ref 0–44)
AST: 20 IU/L (ref 0–40)
Albumin: 4.4 g/dL (ref 4.0–5.0)
Alkaline Phosphatase: 73 IU/L (ref 39–117)
Bilirubin Total: 0.5 mg/dL (ref 0.0–1.2)
Bilirubin, Direct: 0.16 mg/dL (ref 0.00–0.40)
Total Protein: 6.4 g/dL (ref 6.0–8.5)

## 2019-05-13 IMAGING — CR DG CHEST 2V
2 series · 2 of 2 positions shown · non-contrast
Comparison: 08/08/2017

CLINICAL DATA: Post CABG x2 08/06/2016.

EXAM:
CHEST  2 VIEW

[chest pa]
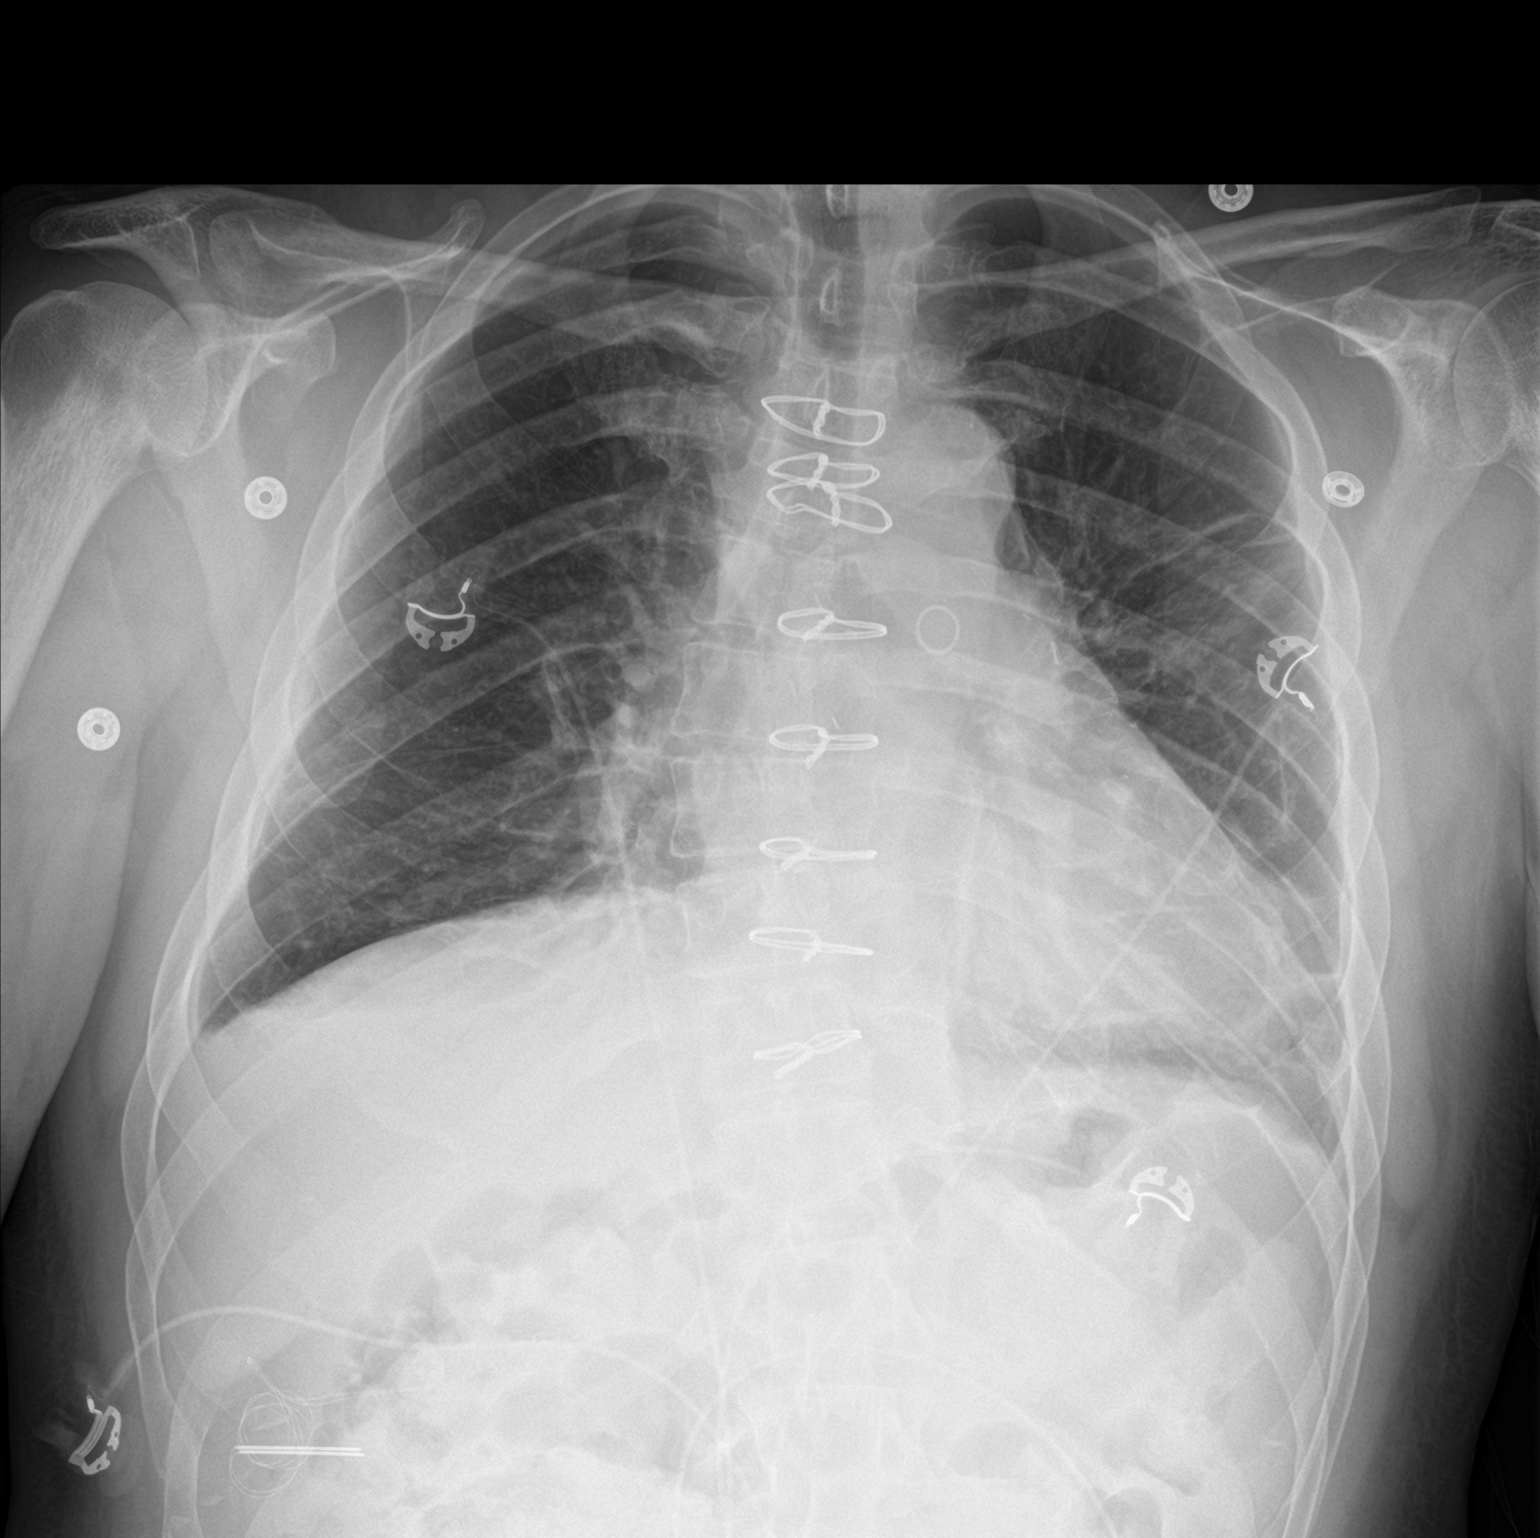

[chest lat]
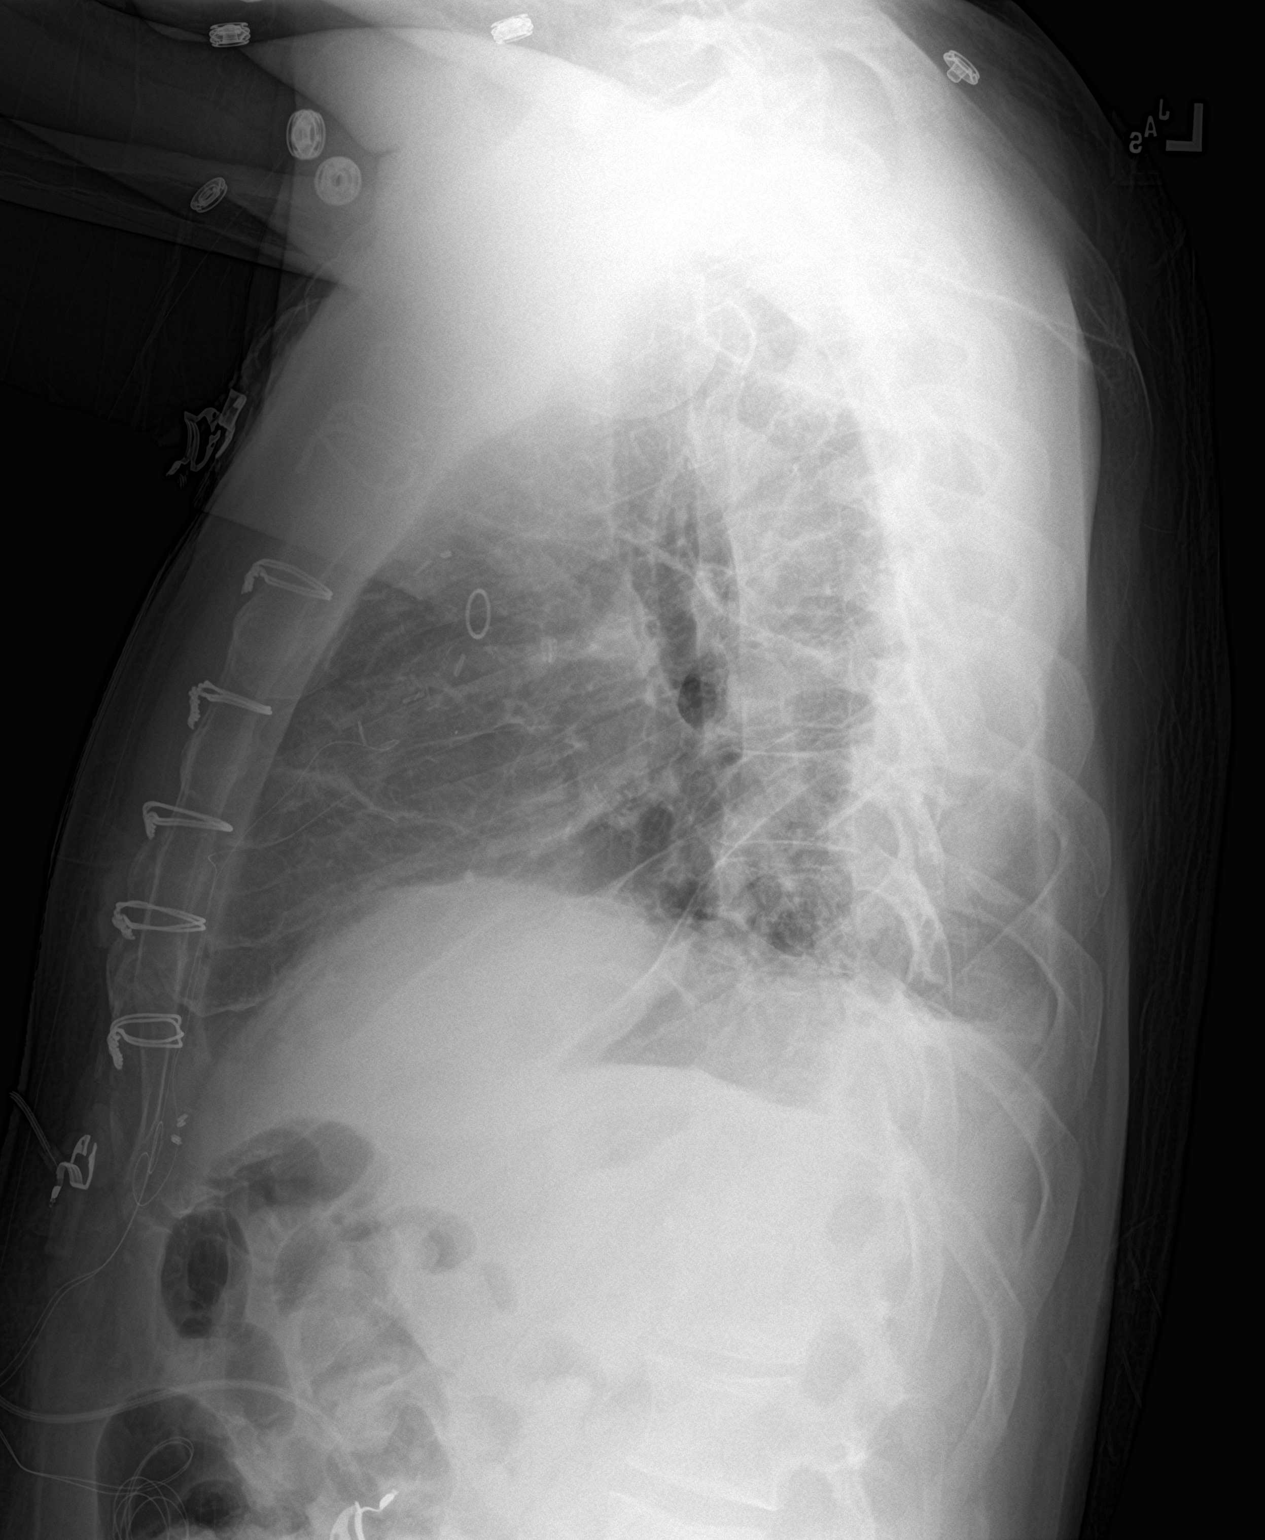

[2 of 2 positions shown; findings below may reference images not displayed]

FINDINGS: Sternotomy wires are unchanged. Interval removal of multiple tubes
and lines. Lungs are adequately inflated with mild linear
atelectasis over the left mid to lower lung. No significant
effusion. Cardiomediastinal silhouette and remainder the exam is
unchanged.
IMPRESSION: Minimal patchy atelectatic change over the left mid to lower lung.

## 2019-07-16 ENCOUNTER — Other Ambulatory Visit: Payer: Self-pay | Admitting: Interventional Cardiology

## 2019-08-19 ENCOUNTER — Other Ambulatory Visit: Payer: Self-pay | Admitting: Interventional Cardiology

## 2019-09-16 ENCOUNTER — Other Ambulatory Visit: Payer: Self-pay | Admitting: Interventional Cardiology

## 2019-11-19 ENCOUNTER — Other Ambulatory Visit: Payer: Self-pay | Admitting: Interventional Cardiology

## 2019-12-18 ENCOUNTER — Other Ambulatory Visit: Payer: Self-pay | Admitting: Interventional Cardiology

## 2019-12-20 ENCOUNTER — Other Ambulatory Visit: Payer: Self-pay | Admitting: Interventional Cardiology

## 2019-12-29 ENCOUNTER — Other Ambulatory Visit: Payer: Self-pay | Admitting: Interventional Cardiology

## 2019-12-31 NOTE — Progress Notes (Signed)
Cardiology Office Note   Date:  01/02/2020   ID:  KINLEY FERRENTINO, DOB [birth year], MRN [id number]  PCP:  Buckner Malta, MD  Cardiologist:  Dr. Katrinka Blazing, MD   Chief Complaint  Patient presents with  . Follow-up   History of Present Illness: Adrian Graham is a 49 y.o. male who presents for follow up, seen for Dr. Katrinka Blazing.   Adrian Graham has a history of CAD s/p STEMI 2018 with PCI to LCx with recurrent angina 07/2017>>>CABG (LIMA to LAD, SVG to diag), DM2 and hyperlipidemia.   He was seen 07/2017 with recurrent chest pain (after STEMI 2018 with PCI to LCx) and was referred for repeat cardiac catheterization with subsequent CABG per Dr. Cornelius Moras. Postoperative course was unremarkable. TEE performed at the time of surgery showed small PFO with left-to-right shunt and PFO closure device present? There is no mention of this in the operative note.   He was last seen 11/02/18 for follow up at which time he had some complaints of mild, intermittent palpitations which were occurring since prior to surgery approximately once or twice per week. He had no shortness of breath, dizziness, chest pain, orthopnea, LE swelling, PND or syncope. If his symptoms change, the next step would be to place a ZIO for 2 weeks.  Today he reports that he has been doing well from a CV standpoint.  States that his palpitations have subsided only occurring once every several months.  He denies anginal symptoms.  Denies shortness of breath, LE edema, orthopnea, dizziness or syncope.  States that he is no longer using nicotine however has reverted to eating more food has subsequently gained 10 to 15 pounds over the last several months.  He reports he is interested in increasing his exercise activity to help with this.  Reports no intolerances to his medications and has been compliant.  Overall is doing well.  Past Medical History:  Diagnosis Date  . Acute myocardial infarction (HCC) 04/25/2017  . Acute ST elevation  myocardial infarction (STEMI) of inferior wall (HCC) 04/25/2017  . Arthritis    "hands" (08/05/2017)  . Coronary artery disease   . Family history of early CAD 04/25/2017  . Hyperlipidemia LDL goal <70 04/25/2017  . Hypertension   . Nasal congestion   . Pre-diabetes   . 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 CARDIAC CATHETERIZATION    . CARPAL TUNNEL RELEASE Left   . CORONARY ARTERY BYPASS GRAFT N/A 08/06/2017   Procedure: OFF PUMP CORONARY ARTERY BYPASS GRAFTING (CABG) x 2 WITH OPEN HARVESTING OF RIGHT LOWER SAPHENOUS VEIN;  Surgeon: Purcell Nails, MD;  Location: [facility] OR;  Service: Open Heart Surgery;  Laterality: N/A;  . CORONARY/GRAFT ACUTE MI REVASCULARIZATION N/A 04/25/2017   Procedure: Coronary/Graft Acute MI Revascularization;  Surgeon: Lyn Records, MD;  Location: [facility] INVASIVE CV LAB;  Service: Cardiovascular;  Laterality: N/A;  . LEFT HEART CATH AND CORONARY ANGIOGRAPHY N/A 04/25/2017   Procedure: LEFT HEART CATH AND CORONARY ANGIOGRAPHY;  Surgeon: Lyn Records, MD;  Location: [facility] INVASIVE CV LAB;  Service: Cardiovascular;  Laterality: N/A;  . LEFT HEART CATH AND CORONARY ANGIOGRAPHY N/A 07/28/2017   Procedure: LEFT HEART CATH AND CORONARY ANGIOGRAPHY;  Surgeon: Lyn Records, MD;  Location: [facility] INVASIVE CV LAB;  Service: Cardiovascular;  Laterality: N/A;  . TEE WITHOUT CARDIOVERSION N/A  08/06/2017   Procedure: TRANSESOPHAGEAL ECHOCARDIOGRAM (TEE);  Surgeon: Purcell Nails, MD;  Location: [facility] OR;  Service: Open Heart Surgery;  Laterality: N/A;  . WISDOM TOOTH EXTRACTION       Current Outpatient Medications  Medication Sig Dispense Refill  . aspirin 81 MG chewable tablet Chew 1 tablet (81 mg total) daily by mouth.    Marland Kitchen atorvastatin (LIPITOR) 80 MG tablet Take 1 tablet (80 mg total) by mouth daily. Must make follow up appt for further refills [phone] 15 tablet 0  . carvedilol  (COREG) 6.25 MG tablet Take 1 tablet (6.25 mg total) by mouth 2 (two) times daily with a meal. Please make overdue appt with Dr. Katrinka Blazing before anymore refills. 3rd and Final Attempt 30 tablet 0  . nitroGLYCERIN (NITROSTAT) 0.4 MG SL tablet Place 1 tablet (0.4 mg total) every 5 (five) minutes as needed under the tongue for chest pain. 25 tablet 12   No current facility-administered medications for this visit.    Allergies:   Metformin and related    Social History:  The patient  reports that he quit smoking about 2 years ago. His smoking use included e-cigarettes and cigarettes. He has a 10.00 pack-year smoking history. He has quit using smokeless tobacco.  His smokeless tobacco use included chew. He reports that he does not drink alcohol and does not use drugs.   Family History:  The patient's family history includes CAD in his brother; CAD (age of onset: 16) in his mother; Cancer in his brother; Heart attack in his brother and brother; Heart attack (age of onset: 73) in his father.    ROS:  Please see the history of present illness. Otherwise, review of systems are positive for none.   All other systems are reviewed and negative.    PHYSICAL EXAM: VS:  BP (!) 160/90   Pulse 66   Ht 5\' 9"  (1.753 m)   Wt 210 lb 9.6 oz (95.5 kg)   SpO2 98%   BMI 31.10 kg/m  , BMI Body mass index is 31.1 kg/m.   General: Well developed, well nourished, NAD Lungs:Clear to ausculation bilaterally. No wheezes Cardiovascular: RRR with S1 S2. No murmurs Extremities: No edema. Neuro: Alert and oriented. No focal deficits. No facial asymmetry. MAE spontaneously. Psych: Responds to questions appropriately with normal affect.     EKG:  EKG is ordered today. The ekg ordered today demonstrates NSR, HR 66 bpm with no acute abnormalities.   Recent Labs: 04/22/2019: ALT 20; BUN 20; Creatinine, Ser 1.25; Potassium 4.6; Sodium 139    Lipid Panel    Component Value Date/Time   CHOL 122 04/22/2019 0821    TRIG 78 04/22/2019 0821   HDL 37 (L) 04/22/2019 0821   CHOLHDL 3.3 04/22/2019 0821   CHOLHDL 8.4 04/25/2017 0909   VLDL UNABLE TO CALCULATE IF TRIGLYCERIDE OVER 400 mg/dL 13/08/2016 20/25/4270   LDLCALC 69 04/22/2019 0821    Wt Readings from Last 3 Encounters:  01/02/20 210 lb 9.6 oz (95.5 kg)  11/02/18 187 lb (84.8 kg)  08/16/18 198 lb (89.8 kg)     Other studies Reviewed: Additional studies/ records that were reviewed today include:  Review of the above records demonstrates:   Cardiac catheterization 07/28/2017:  Ostial to proximal 95% LAD stenosis showing evidence of progression since acute myocardial infarction in November 2018.  Widely patent left main  Widely patent circumflex stent placed during acute myocardial infarction in November 2018. First obtuse marginal with 85% stenosis. First obtuse marginal  is jailed. 40-50% stenosis distal to stent.  Luminal irregularities in the mid and distal RCA.  Normal left ventricular systolic function. Ejection fraction is 65%. LVEDP is normal.  RECOMMENDATIONS:   With ostial LAD and moderate mid vessel LAD disease involving a large diagonal, I believe surgical revascularization is the patient's best option with arterial conduit to the LAD. This could perhaps be performed off pump. The first obtuse marginal is not large enough to bypass.  The major issue now is antiplatelet therapy to avoid stent thrombosis in preparation for bypass surgery. He is on Brilinta. Will need to hold Brilinta at least 5 days. We will get him set to see a surgeon within the next 48 hours to establish a game plan for moving forward. With a second generation drug-eluting stent (everolimus), it is relatively safe to discontinue Brilinta for 5 days prior to surgery without an excessive risk of acute stent thrombosis.  ASSESSMENT AND PLAN:  1.  CAD s/p CABG 2019: -s/p LIMA to LAD and SVG to DX -Stable, no anginal complaints -Plavix discontinued at last  office visit 03/2018 -Continue ASA 81, high intensity statin, carvedilol -Has SL NTG, PRN without need for use  2.  Hyperlipidemia: -LDL, 59 on 04/26/2018; at goal of LDL<70 -Continue high intensity atorvastatin -Repeat lipid and LFTs >>> scheduled for this week  3.  DM2: -Last hemoglobin A1c, 6.6 on 08/02/2016 -Encouraged low carbohydrate, low sugar diet with increased aerobic activity with at least 30 minutes/day / 4 days/week  4.  Hypertension: -Elevated today.  Encouraged to obtain home BP monitor.  If home BPs remain elevated greater than patient is to call the office.  At that time, would consider increasing carvedilol to 12.5 mg p.o. twice daily -Continue current dosing of carvedilol at this time however if BP remains elevated will need to titrate up   5.  Palpitations: -Denies recurrence   Current medicines are reviewed at length with the patient today.  The patient does not have concerns regarding medicines.  The following changes have been made:  no change  Labs/ tests ordered today include: Lipid panel, LFTs  Orders Placed This Encounter  Procedures  . Lipid panel  . Hepatic function panel  . EKG 12-Lead   Disposition:   FU with Dr. Katrinka Blazing in 6 months  Signed,   Georgie Chard, NP  01/02/2020 3:12 PM    [facility] [address] [place], [place], [place]  [address] Phone: [phone]; Fax: [phone]

## 2020-01-02 ENCOUNTER — Other Ambulatory Visit: Payer: Self-pay

## 2020-01-02 ENCOUNTER — Ambulatory Visit (INDEPENDENT_AMBULATORY_CARE_PROVIDER_SITE_OTHER): Payer: PRIVATE HEALTH INSURANCE | Admitting: Cardiology

## 2020-01-02 ENCOUNTER — Encounter: Payer: Self-pay | Admitting: Cardiology

## 2020-01-02 VITALS — BP 160/90 | HR 66 | Ht 69.0 in | Wt 210.6 lb

## 2020-01-02 DIAGNOSIS — Z955 Presence of coronary angioplasty implant and graft: Secondary | ICD-10-CM

## 2020-01-02 DIAGNOSIS — I25810 Atherosclerosis of coronary artery bypass graft(s) without angina pectoris: Secondary | ICD-10-CM | POA: Diagnosis not present

## 2020-01-02 DIAGNOSIS — I10 Essential (primary) hypertension: Secondary | ICD-10-CM

## 2020-01-02 DIAGNOSIS — Z951 Presence of aortocoronary bypass graft: Secondary | ICD-10-CM

## 2020-01-02 DIAGNOSIS — E785 Hyperlipidemia, unspecified: Secondary | ICD-10-CM | POA: Diagnosis not present

## 2020-01-02 NOTE — Patient Instructions (Signed)
Medication Instructions:  Your physician recommends that you continue on your current medications as directed. Please refer to the Current Medication list given to you today.  *If you need a refill on your cardiac medications before your next appointment, please call your pharmacy*   Lab Work: TO BE DONE IN 1 WEEK: FASTING LFTS, LIPIDS If you have labs (blood work) drawn today and your tests are completely normal, you will receive your results only by: Marland Kitchen MyChart Message (if you have MyChart) OR . A paper copy in the mail If you have any lab test that is abnormal or we need to change your treatment, we will call you to review the results.   Testing/Procedures: NONE  Follow-Up: At [facility], you and your health needs are our priority.  As part of our continuing mission to provide you with exceptional heart care, we have created designated Provider Care Teams.  These Care Teams include your primary Cardiologist (physician) and Advanced Practice Providers (APPs -  Physician Assistants and Nurse Practitioners) who all work together to provide you with the care you need, when you need it.  We recommend signing up for the patient portal called "MyChart".  Sign up information is provided on this After Visit Summary.  MyChart is used to connect with patients for Virtual Visits (Telemedicine).  Patients are able to view lab/test results, encounter notes, upcoming appointments, etc.  Non-urgent messages can be sent to your provider as well.   To learn more about what you can do with MyChart, go to [email or web address].    Your next appointment:   6 month(s)  The format for your next appointment:   In Person  Provider:   You may see Lesleigh Noe, MD or one of the following Advanced Practice Providers on your designated Care Team:    Norma Fredrickson, NP  Nada Boozer, NP  Georgie Chard, NP

## 2020-01-09 ENCOUNTER — Other Ambulatory Visit: Payer: Self-pay

## 2020-01-09 ENCOUNTER — Other Ambulatory Visit: Payer: PRIVATE HEALTH INSURANCE | Admitting: *Deleted

## 2020-01-09 DIAGNOSIS — I10 Essential (primary) hypertension: Secondary | ICD-10-CM

## 2020-01-09 DIAGNOSIS — E785 Hyperlipidemia, unspecified: Secondary | ICD-10-CM

## 2020-01-09 DIAGNOSIS — I25810 Atherosclerosis of coronary artery bypass graft(s) without angina pectoris: Secondary | ICD-10-CM

## 2020-01-09 LAB — HEPATIC FUNCTION PANEL
ALT: 20 IU/L (ref 0–44)
AST: 28 IU/L (ref 0–40)
Albumin: 4.4 g/dL (ref 4.0–5.0)
Alkaline Phosphatase: 71 IU/L (ref 48–121)
Bilirubin Total: 0.4 mg/dL (ref 0.0–1.2)
Bilirubin, Direct: 0.13 mg/dL (ref 0.00–0.40)
Total Protein: 6.7 g/dL (ref 6.0–8.5)

## 2020-01-09 LAB — LIPID PANEL
Chol/HDL Ratio: 2.9 ratio (ref 0.0–5.0)
Cholesterol, Total: 103 mg/dL (ref 100–199)
HDL: 36 mg/dL — ABNORMAL LOW (ref >39)
LDL Chol Calc (NIH): 53 mg/dL (ref 0–99)
Triglycerides: 64 mg/dL (ref 0–149)
VLDL Cholesterol Cal: 14 mg/dL (ref 5–40)

## 2020-01-12 ENCOUNTER — Other Ambulatory Visit: Payer: Self-pay

## 2020-01-12 MED ORDER — CARVEDILOL 6.25 MG PO TABS: 6.2500 mg | ORAL_TABLET | Freq: Two times a day (BID) | ORAL | 3 refills | Status: DC

## 2020-01-12 MED ORDER — ATORVASTATIN CALCIUM 80 MG PO TABS: 80.0000 mg | ORAL_TABLET | Freq: Every day | ORAL | 3 refills | Status: DC

## 2020-04-23 ENCOUNTER — Telehealth: Payer: Self-pay | Admitting: Cardiology

## 2020-04-23 DIAGNOSIS — I10 Essential (primary) hypertension: Secondary | ICD-10-CM

## 2020-04-23 NOTE — Telephone Encounter (Signed)
Called patient back about his message. Patient is complaining about elevated BP and a slight headache. Patient denies any chest pain or SOB. Patient's HR 80's to 90's. At last office visit with Georgie Chard she suggested patient increase his Coreg 12.5 mg BID if BP continued to be high. Consulted DOD, Dr. Clifton James, he agreed with plan. Called patient back and informed him of medication change. Patient will keep a log of BP for a week and will let our office know how he is doing.

## 2020-04-23 NOTE — Telephone Encounter (Signed)
New Message:   When he saw Adrian Graham for his last office visit, she told him him if he had issues with his blood pressure to please call.    Pt c/o BP issue: STAT if pt c/o blurred vision, one-sided weakness or slurred speech  1. What are your last 5 BP readings?  Today is 162/107, yesterday it was 150/102 and 150/100  2. Are you having any other symptoms (ex. Dizziness, headache, blurred vision, passed out)?   3. What is your BP issue?  Running high*

## 2020-04-26 NOTE — Telephone Encounter (Signed)
Pt called back In and stated he has doubled on the carvedilol (COREG) 6.25 MG tablet [[id number]]  Since Monday afternoon and it is not helping.    He state in the evenings it comes down some when he is sitting down and not doing much.   He stated he check it at lunch today and it was still 162/108 Pt reports a very mild headache.    Please advise  Best number [phone]

## 2020-04-26 NOTE — Telephone Encounter (Signed)
Spoke with pt who states he has started taking his increased dose of Coreg to 12.5mg  bid as advised on 04/23/2020 per Dr Sanjuana Kava.  Pt reports his B/P today after lunch was 162/108.  Pt reports BP has been improved when he has checked BP in the later afternoon.  Pt denies CP, SOB, dizziness or changes in speech or vision.  Pt advised it may take several days to a couple of weeks to reach optimal results.  Pt advised to take BP 1-2 hours after he has taken his medication with both feet on the floor and arm at heart level resting on a table.  Pt states he does eat take out food but not every day as well as bacon, sausage and deli meat.  Pt drinks coffee with caffeine.   Encouraged pt to really watch his salt intake and to stop take out foods, preserved foods.  Increase fresh and frozen fruits and vegetables. Stop caffeine intake. Pt states he has let his "diet go" and has gained weight as well.  Decrease stress level as much as possible.  Continue monitoring BP and keep daily log. Continue medications as prescribed. Pt will contact office on Monday 04/30/2020 with BP readings.   Will forward information to Dr Katrinka Blazing and Georgie Chard for review and further recommendations at this time.  Pt verbalizes understanding and agrees with current plan.

## 2020-04-30 NOTE — Telephone Encounter (Signed)
If BP no better, start Chlorthalidone 12.5 mg daily. BMET in 10 days.

## 2020-04-30 NOTE — Telephone Encounter (Signed)
Spoke with pt and he states BP numbers are looking better.  He is watching salt intake and has stopped taking his BP so many times a day because he feels it was making him anxious and driving the numbers up.  Most recent BPs are as follows:  Fri AM- 139/93 Fri PM- 137/89 Sat PM- 134/93 Sun AM- 129/83 Sun PM- 127/80 Mon AM- 131/88  Pt will continue to monitor BP this week and call me or Mychart me on Friday with those readings.  Advised if BP elevated at that time, we will need to start the new medication.  Pt agreeable to plan.

## 2020-05-04 NOTE — Telephone Encounter (Signed)
Pt called back this morning to let Victorino Dike know what his be has been running since Tuesday   Tues  Am- 134/90 Pm -119/78  Wed  Am -134/92 Pm -122/88  Thurs  Am -128/97 Pm -122/78  Friday  Am -128/87   Best number for pt [phone]

## 2020-05-04 NOTE — Telephone Encounter (Signed)
Find out if the AM BP is before or after medications.

## 2020-05-04 NOTE — Telephone Encounter (Signed)
Called the patient back to let him know that Victorino Dike, RN is out of office today but his BP readings look good and are within normal limits.    Informed patient that I would route these readings to Victorino Dike, RN and Dr. Katrinka Blazing for further review and if there were any new recommendations, he would receive a call back next week.

## 2020-05-07 NOTE — Telephone Encounter (Signed)
Spoke with pt and he states morning BPs are 2-2.5 hours after morning medications.

## 2020-05-08 MED ORDER — LOSARTAN POTASSIUM 25 MG PO TABS
25.0000 mg | ORAL_TABLET | Freq: Every evening | ORAL | 3 refills | Status: DC
Start: 2020-05-08 — End: 2021-05-06

## 2020-05-08 NOTE — Addendum Note (Signed)
Addended by: Julio Sicks on: 05/08/2020 02:54 PM   Modules accepted: Orders

## 2020-05-08 NOTE — Telephone Encounter (Signed)
Spoke with pt and made him aware of recommendations per Dr. Katrinka Blazing.  Pt will come for labs on 11/29.  Pt verbalized understanding and was in agreement with this plan.  Pt appreciative for call.

## 2020-05-08 NOTE — Telephone Encounter (Signed)
Target is < 130/80 mmHg. Start Losartan 25 mg each PM. BMET in 7-14 days

## 2020-05-09 ENCOUNTER — Other Ambulatory Visit: Payer: PRIVATE HEALTH INSURANCE

## 2020-05-21 ENCOUNTER — Other Ambulatory Visit: Payer: Self-pay

## 2020-05-21 ENCOUNTER — Other Ambulatory Visit: Payer: PRIVATE HEALTH INSURANCE | Admitting: *Deleted

## 2020-05-21 DIAGNOSIS — I10 Essential (primary) hypertension: Secondary | ICD-10-CM

## 2020-05-21 LAB — BASIC METABOLIC PANEL
BUN/Creatinine Ratio: 16 (ref 9–20)
BUN: 19 mg/dL (ref 6–24)
CO2: 24 mmol/L (ref 20–29)
Calcium: 9.2 mg/dL (ref 8.7–10.2)
Chloride: 102 mmol/L (ref 96–106)
Creatinine, Ser: 1.17 mg/dL (ref 0.76–1.27)
GFR calc Af Amer: 85 mL/min/{1.73_m2} (ref >59)
GFR calc non Af Amer: 73 mL/min/{1.73_m2} (ref >59)
Glucose: 174 mg/dL — ABNORMAL HIGH (ref 65–99)
Potassium: 5.1 mmol/L (ref 3.5–5.2)
Sodium: 136 mmol/L (ref 134–144)

## 2020-05-25 ENCOUNTER — Other Ambulatory Visit: Payer: Self-pay | Admitting: *Deleted

## 2020-05-25 MED ORDER — CARVEDILOL 12.5 MG PO TABS
12.5000 mg | ORAL_TABLET | Freq: Two times a day (BID) | ORAL | 3 refills | Status: DC
Start: 2020-05-25 — End: 2021-02-28

## 2020-06-01 ENCOUNTER — Telehealth: Payer: Self-pay | Admitting: Interventional Cardiology

## 2020-06-01 NOTE — Telephone Encounter (Signed)
Called pt to inform him that his medication was sent to his pharmacy already and that he needed to call his pharmacy to speak with a live person to request a refill and if he has any other problems, questions or concerns, to call the office back. Pt verbalize understanding.

## 2020-06-01 NOTE — Telephone Encounter (Signed)
*  STAT* If patient is at the pharmacy, call can be transferred to refill team.   1. Which medications need to be refilled? (please list name of each medication and dose if known) carvedilol (COREG) 12.5 MG tablet  2. Which pharmacy/location (including street and city if local pharmacy) is medication to be sent to? [id number] - [place], [place] - [address]  3. Do they need a 30 day or 90 day supply? 90   This medication was doubled ( 2 in AM and 2 in PM) so he ran out earlier than he was supposed to.

## 2020-06-07 ENCOUNTER — Telehealth: Payer: Self-pay | Admitting: Interventional Cardiology

## 2020-06-07 NOTE — Telephone Encounter (Signed)
Attempted to call pt with no answer or ability to leave a VM. Will forward to Dr. Lonn Georgia RN for review.

## 2020-06-07 NOTE — Telephone Encounter (Signed)
° ° °  Pt c/o BP issue: STAT if pt c/o blurred vision, one-sided weakness or slurred speech  1. What are your last 5 BP readings?  11/29 BP 150/100 11/28 BP 141/98 11/29 BP 131/87 11/30 BP 123/91 12/01 BP 135/93       ----- 12/03 BP 123/82 12/04 BP 128/83 12/05 BP 134/90  12/11 BP 123/82 12/12 BP 128/81 12/13 BP 107/78 12/14 BP 119/82 12/15 BP 120/80 12/16 BP 128/88   2. Are you having any other symptoms (ex. Dizziness, headache, blurred vision, passed out)?   3. What is your BP issue? Pt said it seems his BP went down after taking losartan however, he had some itching and redness on his skin, he wanted to know if he can use some cortisone cream

## 2020-06-08 NOTE — Telephone Encounter (Signed)
Rash on left bicep and underarm.  Denies itching.  States he believes the rash was there prior to starting the Losartan. Rash is actually better today.  Advised ok to use Cortisone Cream.  Told pt if rash returns once it has cleared, please let us know as it could be a side effect of the medication.  Pt verbalized understanding and was in agreement with this plan.

## 2020-07-04 NOTE — Telephone Encounter (Signed)
Patient calling back in to let Victorino Dike that the rash started to go away but has come back 'pretty good'. Patient states it doesn't itch or anything but he was told to let Victorino Dike know. Please advise   Thank you!

## 2020-07-04 NOTE — Telephone Encounter (Signed)
Patient calling in to let Adrian Graham know that his left underarm rash has came back. See 06/08/20 phone note. Patient denies any itching, swelling or heat to the rash. He states it was getting better over the last couple weeks but has appeared again. I let him know that JB, RN is out of office today but I would pass along the message.

## 2020-07-05 NOTE — Telephone Encounter (Signed)
See PCP about rash.

## 2020-07-06 NOTE — Telephone Encounter (Signed)
Spoke with pt and made him aware of recommendations. Pt agreeable to contact PCP for eval.

## 2020-07-23 NOTE — Progress Notes (Signed)
Cardiology Office Note:    Date:  07/25/2020   ID:  Adrian Graham, DOB Sep 22, 1970, MRN [id number]  PCP:  Buckner Malta, MD  Cardiologist:  Lesleigh Noe, MD   Referring MD: Buckner Malta, MD   Chief Complaint  Patient presents with  . Coronary Artery Disease  . Hyperlipidemia    History of Present Illness:    Adrian Graham is a 50 y.o. male with a hx of  CADs/p STEMI 2018 with PCI to LCx, AND recurrent angina 07/2017 CABG (LIMA to LAD, SVG to diag),pre-DM2, PRIMARY HYPERTENSION,  and hyperlipidemia.  The patient is doing well. He has gained weight. He will stop smoking. We had to add losartan because of elevated blood pressures. He feels his blood work will not be in good shape because he has gained weight. He has not had angina or symptoms of ischemia.  Past Medical History:  Diagnosis Date  . Acute myocardial infarction (HCC) 04/25/2017  . Acute ST elevation myocardial infarction (STEMI) of inferior wall (HCC) 04/25/2017  . Arthritis    "hands" (08/05/2017)  . Coronary artery disease   . Family history of early CAD 04/25/2017  . Hyperlipidemia LDL goal <70 04/25/2017  . Hypertension   . Nasal congestion   . Pre-diabetes   . 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 CARDIAC CATHETERIZATION    . CARPAL TUNNEL RELEASE Left   . CORONARY ARTERY BYPASS GRAFT N/A 08/06/2017   Procedure: OFF PUMP CORONARY ARTERY BYPASS GRAFTING (CABG) x 2 WITH OPEN HARVESTING OF RIGHT LOWER SAPHENOUS VEIN;  Surgeon: Purcell Nails, MD;  Location: [facility] OR;  Service: Open Heart Surgery;  Laterality: N/A;  . CORONARY/GRAFT ACUTE MI REVASCULARIZATION N/A 04/25/2017   Procedure: Coronary/Graft Acute MI Revascularization;  Surgeon: Lyn Records, MD;  [facility];  Service: Cardiovascular;  Laterality: N/A;  . LEFT HEART CATH AND CORONARY ANGIOGRAPHY N/A 04/25/2017    Procedure: LEFT HEART CATH AND CORONARY ANGIOGRAPHY;  Surgeon: Lyn Records, MD;  [facility];  Service: Cardiovascular;  Laterality: N/A;  . LEFT HEART CATH AND CORONARY ANGIOGRAPHY N/A 07/28/2017   Procedure: LEFT HEART CATH AND CORONARY ANGIOGRAPHY;  Surgeon: Lyn Records, MD;  [facility];  Service: Cardiovascular;  Laterality: N/A;  . TEE WITHOUT CARDIOVERSION N/A 08/06/2017   Procedure: TRANSESOPHAGEAL ECHOCARDIOGRAM (TEE);  Surgeon: Purcell Nails, MD;  Location: [facility] OR;  Service: Open Heart Surgery;  Laterality: N/A;  . WISDOM TOOTH EXTRACTION      Current Medications: Current Meds  Medication Sig  . aspirin 81 MG chewable tablet Chew 1 tablet (81 mg total) daily by mouth.  Marland Kitchen atorvastatin (LIPITOR) 80 MG tablet Take 1 tablet (80 mg total) by mouth daily.  . carvedilol (COREG) 12.5 MG tablet Take 1 tablet (12.5 mg total) by mouth 2 (two) times daily.  Marland Kitchen losartan (COZAAR) 25 MG tablet Take 1 tablet (25 mg total) by mouth every evening.  . nitroGLYCERIN (NITROSTAT) 0.4 MG SL tablet Place 1 tablet (0.4 mg total) every 5 (five) minutes as needed under the tongue for chest pain.     Allergies:   Metformin and related   Social History   Socioeconomic History  . Marital status: Divorced    Spouse name: Not on file  . Number of children: Not on file  .  Years of education: Not on file  . Highest education level: Not on file  Occupational History  . Not on file  Tobacco Use  . Smoking status: Former Smoker    Packs/day: 1.00    Years: 10.00    Pack years: 10.00    Types: E-cigarettes, Cigarettes    Quit date: 05/02/2017    Years since quitting: 3.2  . Smokeless tobacco: Former Neurosurgeon    Types: Engineer, drilling  . Vaping Use: Former  . Quit date: 05/02/2017  Substance and Sexual Activity  . Alcohol use: No  . Drug use: No  . Sexual activity: Not Currently  Other Topics Concern  . Not on file  Social History Narrative  . Not on file    Social Determinants of Health   Financial Resource Strain: Not on file  Food Insecurity: Not on file  Transportation Needs: Not on file  Physical Activity: Not on file  Stress: Not on file  Social Connections: Not on file     Family History: The patient's family history includes CAD in his brother; CAD (age of onset: 78) in his mother; Cancer in his brother; Heart attack in his brother and brother; Heart attack (age of onset: 37) in his father.  ROS:   Please see the history of present illness.    He gets subcostal discomfort all other systems reviewed and are negative.  EKGs/Labs/Other Studies Reviewed:    The following studies were reviewed today: No new data  EKG:  EKG no new data  Recent Labs: 01/09/2020: ALT 20 05/21/2020: BUN 19; Creatinine, Ser 1.17; Potassium 5.1; Sodium 136  Recent Lipid Panel    Component Value Date/Time   CHOL 103 01/09/2020 0815   TRIG 64 01/09/2020 0815   HDL 36 (L) 01/09/2020 0815   CHOLHDL 2.9 01/09/2020 0815   CHOLHDL 8.4 04/25/2017 0909   VLDL UNABLE TO CALCULATE IF TRIGLYCERIDE OVER 400 mg/dL 12/45/8099 8338   LDLCALC 53 01/09/2020 0815    Physical Exam:    VS:  BP 128/76   Pulse 78   Ht 5\' 9"  (1.753 m)   Wt 216 lb 3.2 oz (98.1 kg)   SpO2 97%   BMI 31.93 kg/m     Wt Readings from Last 3 Encounters:  07/25/20 216 lb 3.2 oz (98.1 kg)  01/02/20 210 lb 9.6 oz (95.5 kg)  11/02/18 187 lb (84.8 kg)     GEN: Mildly overweight. No acute distress HEENT: Normal NECK: No JVD. LYMPHATICS: No lymphadenopathy CARDIAC: No murmur. RRR no gallop, or edema. VASCULAR:  Normal Pulses. No bruits. RESPIRATORY:  Clear to auscultation without rales, wheezing or rhonchi  ABDOMEN: Soft, non-tender, non-distended, No pulsatile mass, MUSCULOSKELETAL: No deformity  SKIN: Warm and dry NEUROLOGIC:  Alert and oriented x 3 PSYCHIATRIC:  Normal affect   ASSESSMENT:    1. Coronary artery disease involving coronary bypass graft of native heart  without angina pectoris   2. Primary hypertension   3. Hyperlipidemia LDL goal <70   4. Prediabetes   5. Educated about COVID-19 virus infection    PLAN:    In order of problems listed above:  1. CAD stable. Secondary risk prevention is discussed. 2. Excellent blood pressure control on mid range carvedilol and low-dose losartan. 3. LDL target less than 70. Continue Lipitor 80 mg/day. We will do lipid panel in the next several weeks along with a liver panel. 4. Plan hemoglobin A1c at the same time as lipid blood work. Encouraged aerobic  activity. 5. Vaccinated, educated, and Camera operator.   Overall education and awareness concerning secondary risk prevention was discussed in detail: LDL less than 70, hemoglobin A1c less than 7, blood pressure target less than 130/80 mmHg, >150 minutes of moderate aerobic activity per week, avoidance of smoking, weight control (via diet and exercise), and continued surveillance/management of/for obstructive sleep apnea.    Medication Adjustments/Labs and Tests Ordered: Current medicines are reviewed at length with the patient today.  Concerns regarding medicines are outlined above.  No orders of the defined types were placed in this encounter.  No orders of the defined types were placed in this encounter.   There are no Patient Instructions on file for this visit.   Signed, Lesleigh Noe, MD  07/25/2020 11:48 AM    [facility]

## 2020-07-25 ENCOUNTER — Ambulatory Visit (INDEPENDENT_AMBULATORY_CARE_PROVIDER_SITE_OTHER): Payer: PRIVATE HEALTH INSURANCE | Admitting: Interventional Cardiology

## 2020-07-25 ENCOUNTER — Encounter: Payer: Self-pay | Admitting: Interventional Cardiology

## 2020-07-25 ENCOUNTER — Other Ambulatory Visit: Payer: Self-pay

## 2020-07-25 VITALS — BP 128/76 | HR 78 | Ht 69.0 in | Wt 216.2 lb

## 2020-07-25 DIAGNOSIS — I25810 Atherosclerosis of coronary artery bypass graft(s) without angina pectoris: Secondary | ICD-10-CM | POA: Diagnosis not present

## 2020-07-25 DIAGNOSIS — I10 Essential (primary) hypertension: Secondary | ICD-10-CM | POA: Diagnosis not present

## 2020-07-25 DIAGNOSIS — Z7189 Other specified counseling: Secondary | ICD-10-CM

## 2020-07-25 DIAGNOSIS — R7303 Prediabetes: Secondary | ICD-10-CM

## 2020-07-25 DIAGNOSIS — E785 Hyperlipidemia, unspecified: Secondary | ICD-10-CM

## 2020-07-25 NOTE — Patient Instructions (Signed)
Medication Instructions:  Your physician recommends that you continue on your current medications as directed. Please refer to the Current Medication list given to you today.  *If you need a refill on your cardiac medications before your next appointment, please call your pharmacy*   Lab Work: Lipid, Liver and A1C when you are fasting (nothing to eat or drink after midnight except water and black coffee).  If you have labs (blood work) drawn today and your tests are completely normal, you will receive your results only by: Marland Kitchen MyChart Message (if you have MyChart) OR . A paper copy in the mail If you have any lab test that is abnormal or we need to change your treatment, we will call you to review the results.   Testing/Procedures: None   Follow-Up: At [facility], you and your health needs are our priority.  As part of our continuing mission to provide you with exceptional heart care, we have created designated Provider Care Teams.  These Care Teams include your primary Cardiologist (physician) and Advanced Practice Providers (APPs -  Physician Assistants and Nurse Practitioners) who all work together to provide you with the care you need, when you need it.  We recommend signing up for the patient portal called "MyChart".  Sign up information is provided on this After Visit Summary.  MyChart is used to connect with patients for Virtual Visits (Telemedicine).  Patients are able to view lab/test results, encounter notes, upcoming appointments, etc.  Non-urgent messages can be sent to your provider as well.   To learn more about what you can do with MyChart, go to [email or web address].    Your next appointment:   1 year(s)  The format for your next appointment:   In Person  Provider:   You may see Lesleigh Noe, MD or one of the following Advanced Practice Providers on your designated Care Team:    Georgie Chard, NP    Other Instructions

## 2020-09-26 ENCOUNTER — Other Ambulatory Visit: Payer: PRIVATE HEALTH INSURANCE | Admitting: *Deleted

## 2020-09-26 ENCOUNTER — Other Ambulatory Visit: Payer: Self-pay

## 2020-09-26 DIAGNOSIS — R7303 Prediabetes: Secondary | ICD-10-CM

## 2020-09-26 DIAGNOSIS — I25810 Atherosclerosis of coronary artery bypass graft(s) without angina pectoris: Secondary | ICD-10-CM

## 2020-09-26 DIAGNOSIS — E785 Hyperlipidemia, unspecified: Secondary | ICD-10-CM

## 2020-09-27 LAB — HEPATIC FUNCTION PANEL
ALT: 23 IU/L (ref 0–44)
AST: 21 IU/L (ref 0–40)
Albumin: 4.4 g/dL (ref 4.0–5.0)
Alkaline Phosphatase: 77 IU/L (ref 44–121)
Bilirubin Total: 0.5 mg/dL (ref 0.0–1.2)
Bilirubin, Direct: 0.14 mg/dL (ref 0.00–0.40)
Total Protein: 6.8 g/dL (ref 6.0–8.5)

## 2020-09-27 LAB — LIPID PANEL
Chol/HDL Ratio: 3.4 ratio (ref 0.0–5.0)
Cholesterol, Total: 121 mg/dL (ref 100–199)
HDL: 36 mg/dL — ABNORMAL LOW (ref >39)
LDL Chol Calc (NIH): 58 mg/dL (ref 0–99)
Triglycerides: 154 mg/dL — ABNORMAL HIGH (ref 0–149)
VLDL Cholesterol Cal: 27 mg/dL (ref 5–40)

## 2020-09-27 LAB — HEMOGLOBIN A1C
Est. average glucose Bld gHb Est-mCnc: 174 mg/dL
Hgb A1c MFr Bld: 7.7 % — ABNORMAL HIGH (ref 4.8–5.6)

## 2021-01-06 ENCOUNTER — Other Ambulatory Visit: Payer: Self-pay | Admitting: Interventional Cardiology

## 2021-02-28 ENCOUNTER — Other Ambulatory Visit: Payer: Self-pay | Admitting: Interventional Cardiology

## 2021-04-20 ENCOUNTER — Telehealth: Payer: Self-pay | Admitting: Medical

## 2021-04-20 NOTE — Telephone Encounter (Signed)
   Patient called the after-hours line to report elevated blood pressures despite taking morning medications.   Reports he has been feeling off recently - left work early yesterday due to fatigue. No other specific complaints.  He has had trouble with a pinched nerve in his neck recently but pain was well controlled this morning.  He denies chest pain at rest or with activity, SOB, DOE, palpitations, dizziness, lightheadedness, or syncope. He notes some mild swelling at the end of a long work day but no swelling upon waking in the morning.  No symptoms similar to angina with prior MI/CABG.  He got up this morning to go deer hunting and BP was 170/100.  He took his morning carvedilol 12.5 mg and repeated blood pressure 2 hours later and it was still elevated, though mildly improved, at 153/100.  He has not been checking his blood pressure regularly at home so the acuity of this is unclear.  I suggested taking his losartan 25 mg this morning which she ordinarily takes at 6 PM.  Recommended keeping a close BP log morning/night and to notify the office if BP is persistently > 130/80.  Recommended outpatient follow-up for close monitoring of hypertension.  He tells me he just switched jobs and his new insurance will not kick in until 05/28/2021.  Will route to scheduling to help arrange follow-up at that time.  He was in agreement with the plan and appreciative of the call.  Beatriz Stallion, PA-C 04/20/21; 8:46 AM

## 2021-05-05 ENCOUNTER — Other Ambulatory Visit: Payer: Self-pay | Admitting: Interventional Cardiology

## 2021-05-20 NOTE — Progress Notes (Signed)
Cardiology Office Note    Date:  05/28/2021   ID:  JOSUE FALCONI, DOB 1971/04/30, MRN [id number]   PCP:  Buckner Malta, MD   [facility]  Cardiologist:  Lesleigh Noe, MD   Advanced Practice Provider:  No care team member to display Electrophysiologist:  None   [phone]   Chief Complaint  Patient presents with   Hypertension    History of Present Illness:  DANTRELL SCHERTZER is a 50 y.o. male with a hx of  CAD s/p STEMI 2018 with PCI to LCx, AND recurrent angina 07/2017 CABG (LIMA to LAD, SVG to diag), pre-DM2,  Hypertension,  and hyperlipidemia.    Patient last saw Dr. Katrinka Blazing 07/2020 and was doing well.  Patient called in 04/20/21 with elevated BP. Was advised to take his losartan which he usually takes at night and to keep track of BP's. He had new insurance so didn't want to be seen until today.  Patient comes in for f/u. He says BP was doing better 120/80 so he stopped checking it. BP up today. He watches salt pretty closely. Walks 25 min 5 days/week. No increase stress or pain or weight gain. Denies chest pain, dyspnea, dizziness or presyncope. Some ankle edema. 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    LIMA to LAD SVG to DIAGONAL Open vein harvest RLL    Past Surgical History:  Procedure Laterality Date   CARDIAC CATHETERIZATION     CARPAL TUNNEL RELEASE Left    CORONARY ARTERY BYPASS GRAFT N/A 08/06/2017   Procedure: OFF PUMP CORONARY ARTERY BYPASS GRAFTING (CABG) x 2 WITH OPEN HARVESTING OF RIGHT LOWER SAPHENOUS VEIN;  Surgeon:  Purcell Nails, MD;  Location: [facility] OR;  Service: Open Heart Surgery;  Laterality: N/A;   CORONARY/GRAFT ACUTE MI REVASCULARIZATION N/A 04/25/2017   Procedure: Coronary/Graft Acute MI Revascularization;  Surgeon: Lyn Records, MD;  [facility];  Service: Cardiovascular;  Laterality: N/A;   LEFT HEART CATH AND CORONARY ANGIOGRAPHY N/A 04/25/2017   Procedure: LEFT HEART CATH AND CORONARY ANGIOGRAPHY;  Surgeon: Lyn Records, MD;  [facility]: [facility];  Service: Cardiovascular;  Laterality: N/A;   LEFT HEART CATH AND CORONARY ANGIOGRAPHY N/A 07/28/2017   Procedure: LEFT HEART CATH AND CORONARY ANGIOGRAPHY;  Surgeon: Lyn Records, MD;  Location: [facility];  Service: Cardiovascular;  Laterality: N/A;   TEE WITHOUT CARDIOVERSION N/A 08/06/2017   Procedure: TRANSESOPHAGEAL ECHOCARDIOGRAM (TEE);  Surgeon: Purcell Nails, MD;  Location[facility] OR;  Service: Open Heart Surgery;  Laterality: N/A;   WISDOM TOOTH EXTRACTION      Current Medications: Current Meds  Medication Sig   aspirin 81 MG chewable tablet Chew 1 tablet (81 mg total) daily by mouth.   atorvastatin (LIPITOR) 80 MG tablet TAKE 1 TABLET BY MOUTH EVERY DAY   carvedilol (COREG) 12.5 MG tablet TAKE 1 TABLET BY MOUTH 2 TIMES DAILY.   losartan (COZAAR) 25 MG tablet TAKE 1 TABLET BY MOUTH EVERY  EVENING   nitroGLYCERIN (NITROSTAT) 0.4 MG SL tablet Place 1 tablet (0.4 mg total) every 5 (five) minutes as needed under the tongue for chest pain.     Allergies:   Metformin and related   Social History   Socioeconomic History   Marital status: Divorced    Spouse name: Not on file   Number of children: Not on file   Years of education: Not on file   Highest education level: Not on file  Occupational History   Not on file  Tobacco Use   Smoking status: Former    Packs/day: 1.00    Years: 10.00    Pack years: 10.00    Types: E-cigarettes, Cigarettes    Quit date: 05/02/2017    Years since quitting: 4.0    Smokeless tobacco: Former    Types: Associate Professor Use: Former   Quit date: 05/02/2017  Substance and Sexual Activity   Alcohol use: No   Drug use: No   Sexual activity: Not Currently  Other Topics Concern   Not on file  Social History Narrative   Not on file   Social Determinants of Health   Financial Resource Strain: Not on file  Food Insecurity: Not on file  Transportation Needs: Not on file  Physical Activity: Not on file  Stress: Not on file  Social Connections: Not on file     Family History:  The patient's  family history includes CAD in his brother; CAD (age of onset: 34) in his mother; Cancer in his brother; Heart attack in his brother and brother; Heart attack (age of onset: 79) in his father.   ROS:   Please see the history of present illness.    ROS All other systems reviewed and are negative.   PHYSICAL EXAM:   VS:  BP (!) 130/94   Pulse 62   Ht 5' 9.5" (1.765 m)   Wt 214 lb (97.1 kg)   SpO2 97%   BMI 31.15 kg/m   Physical Exam  GEN: Obese, in no acute distress  Neck: no JVD, carotid bruits, or masses Cardiac:RRR; no murmurs, rubs, or gallops  Respiratory:  clear to auscultation bilaterally, normal work of breathing GI: soft, nontender, nondistended, + BS Ext: without cyanosis, clubbing, or edema, Good distal pulses bilaterally Neuro:  Alert and Oriented x 3 Psych: euthymic mood, full affect  Wt Readings from Last 3 Encounters:  05/28/21 214 lb (97.1 kg)  07/25/20 216 lb 3.2 oz (98.1 kg)  01/02/20 210 lb 9.6 oz (95.5 kg)      Studies/Labs Reviewed:   EKG:  EKG is ordered today.  The ekg ordered today demonstrates NSR  Recent Labs: 09/26/2020: ALT 23   Lipid Panel    Component Value Date/Time   CHOL 121 09/26/2020 0850   TRIG 154 (H) 09/26/2020 0850   HDL 36 (L) 09/26/2020 0850   CHOLHDL 3.4 09/26/2020 0850   CHOLHDL 8.4 04/25/2017 0909   VLDL UNABLE TO CALCULATE IF TRIGLYCERIDE OVER 400 mg/dL 68/61/6837 2902   LDLCALC 58  09/26/2020 0850    Additional studies/ records that were reviewed today include:    Cath 2019 Ostial to proximal 95% LAD stenosis showing evidence of progression since acute myocardial infarction in November 2018. Widely patent left main Widely patent circumflex stent placed during acute myocardial infarction in November 2018.  First obtuse marginal with 85% stenosis.  First obtuse marginal is jailed.  40-50% stenosis distal to stent. Luminal irregularities in the  mid and distal RCA. Normal left ventricular systolic function.  Ejection fraction is 65%.  LVEDP is normal.   RECOMMENDATIONS:   With ostial LAD and moderate mid vessel LAD disease involving a large diagonal, I believe surgical revascularization is the patient's best option with arterial conduit to the LAD.  This could perhaps be performed off pump.  The first obtuse marginal is not large enough to bypass. The major issue now is antiplatelet therapy to avoid stent thrombosis in preparation for bypass surgery.  He is on Brilinta.  Will need to hold Brilinta at least 5 days.  We will get him set to see a surgeon within the next 48 hours to establish a game plan for moving forward.  With a second generation drug-eluting stent (everolimus), it is relatively safe to discontinue Brilinta for 5 days prior to surgery without an excessive risk of acute stent thrombosis.  Risk Assessment/Calculations:         ASSESSMENT:    1. Coronary artery disease involving native coronary artery of native heart without angina pectoris   2. Essential hypertension   3. Hyperlipidemia, unspecified hyperlipidemia type   4. Prediabetes   5. Obesity (BMI 30.0-34.9)   6. Lower extremity edema   7. Medication management      PLAN:  In order of problems listed above:  CAD status post STEMI 2018 treated with PCI to the circumflex, CABG x2 07/2017-no angina, continue ASA, lipitor, coreg, cozaar  Hypertension BP running high. Increase losartan 50 mg  daily  HLD-LDL at goal 09/2020  Prediabetes-A1C 7.7 in April-hasn't seen PCP. Will recheck today  Obesity-weight loss discussed, Mediterranean diet  Ankle edema when standing at work-compression stockings.  Shared Decision Making/Informed Consent        Medication Adjustments/Labs and Tests Ordered: Current medicines are reviewed at length with the patient today.  Concerns regarding medicines are outlined above.  Medication changes, Labs and Tests ordered today are listed in the Patient Instructions below. Patient Instructions  Medication Instructions:   START TAKING  LOSARTAN 50 MG ONCE A DAY    *If you need a refill on your cardiac medications before your next appointment, please call your pharmacy*   Lab Work:  CMET CBC TSH A1C AND LIPID TODAY    RETURN FOR BMET( MEDICATION CHANGE) IN 2 WEEKS   If you have labs (blood work) drawn today and your tests are completely normal, you will receive your results only by: MyChart Message (if you have MyChart) OR A paper copy in the mail If you have any lab test that is abnormal or we need to change your treatment, we will call you to review the results.   Testing/Procedures: NONE ORDERED  TODAY   Follow-Up: At [facility], you and your health needs are our priority.  As part of our continuing mission to provide you with exceptional heart care, we have created designated Provider Care Teams.  These Care Teams include your primary Cardiologist (physician) and Advanced Practice Providers (APPs -  Physician Assistants and Nurse Practitioners) who all work together to provide you with the care you need, when you need it.  We recommend signing up for the patient portal called "MyChart".  Sign up information is provided on this After Visit Summary.  MyChart is used to connect with patients for Virtual Visits (Telemedicine).  Patients are able to view lab/test results, encounter notes, upcoming appointments, etc.  Non-urgent messages can  be sent to your provider as well.   To learn more  about what you can do with MyChart, go to [email or web address].    Your next appointment:   3-4  month(s)  The format for your next appointment:   In Person  Provider:   Lesleigh Noe, MD  /APP    Other Instructions  MAKE SURE FOR NEXT TWO WEEKS TAKE AND MONITOR BLOOD PRESSURE AND SEND  READINGS TO MYCHART.   MAKE SURE YOU PURCHASE COMPRESSION HOES FOR EDEMA( SWELLING)     Signed, Jacolyn Reedy, PA-C  05/28/2021 10:58 AM    [facility] [address] [place], [place], [place]  [address] Phone: [phone]; Fax: [phone]

## 2021-05-28 ENCOUNTER — Encounter: Payer: Self-pay | Admitting: Physician Assistant

## 2021-05-28 ENCOUNTER — Ambulatory Visit (INDEPENDENT_AMBULATORY_CARE_PROVIDER_SITE_OTHER): Payer: No Typology Code available for payment source | Admitting: Physician Assistant

## 2021-05-28 ENCOUNTER — Other Ambulatory Visit: Payer: Self-pay

## 2021-05-28 VITALS — BP 130/94 | HR 62 | Ht 69.5 in | Wt 214.0 lb

## 2021-05-28 DIAGNOSIS — I10 Essential (primary) hypertension: Secondary | ICD-10-CM | POA: Diagnosis not present

## 2021-05-28 DIAGNOSIS — Z79899 Other long term (current) drug therapy: Secondary | ICD-10-CM

## 2021-05-28 DIAGNOSIS — E669 Obesity, unspecified: Secondary | ICD-10-CM

## 2021-05-28 DIAGNOSIS — E785 Hyperlipidemia, unspecified: Secondary | ICD-10-CM

## 2021-05-28 DIAGNOSIS — I2510 Atherosclerotic heart disease of native coronary artery without angina pectoris: Secondary | ICD-10-CM | POA: Diagnosis not present

## 2021-05-28 DIAGNOSIS — R600 Localized edema: Secondary | ICD-10-CM

## 2021-05-28 DIAGNOSIS — E66811 Obesity, class 1: Secondary | ICD-10-CM

## 2021-05-28 DIAGNOSIS — R7303 Prediabetes: Secondary | ICD-10-CM | POA: Diagnosis not present

## 2021-05-28 LAB — COMPREHENSIVE METABOLIC PANEL
ALT: 20 IU/L (ref 0–44)
AST: 21 IU/L (ref 0–40)
Albumin/Globulin Ratio: 2.4 — ABNORMAL HIGH (ref 1.2–2.2)
Albumin: 4.7 g/dL (ref 4.0–5.0)
Alkaline Phosphatase: 72 IU/L (ref 44–121)
BUN/Creatinine Ratio: 18 (ref 9–20)
BUN: 20 mg/dL (ref 6–24)
Bilirubin Total: 0.8 mg/dL (ref 0.0–1.2)
CO2: 23 mmol/L (ref 20–29)
Calcium: 9.3 mg/dL (ref 8.7–10.2)
Chloride: 100 mmol/L (ref 96–106)
Creatinine, Ser: 1.1 mg/dL (ref 0.76–1.27)
Globulin, Total: 2 g/dL (ref 1.5–4.5)
Glucose: 174 mg/dL — ABNORMAL HIGH (ref 70–99)
Potassium: 5 mmol/L (ref 3.5–5.2)
Sodium: 137 mmol/L (ref 134–144)
Total Protein: 6.7 g/dL (ref 6.0–8.5)
eGFR: 82 mL/min/{1.73_m2} (ref >59)

## 2021-05-28 LAB — LIPID PANEL
Chol/HDL Ratio: 3.5 ratio (ref 0.0–5.0)
Cholesterol, Total: 144 mg/dL (ref 100–199)
HDL: 41 mg/dL (ref >39)
LDL Chol Calc (NIH): 87 mg/dL (ref 0–99)
Triglycerides: 83 mg/dL (ref 0–149)
VLDL Cholesterol Cal: 16 mg/dL (ref 5–40)

## 2021-05-28 LAB — CBC
Hematocrit: 40.2 % (ref 37.5–51.0)
Hemoglobin: 13.7 g/dL (ref 13.0–17.7)
MCH: 31.4 pg (ref 26.6–33.0)
MCHC: 34.1 g/dL (ref 31.5–35.7)
MCV: 92 fL (ref 79–97)
Platelets: 169 10*3/uL (ref 150–450)
RBC: 4.37 x10E6/uL (ref 4.14–5.80)
RDW: 11.9 % (ref 11.6–15.4)
WBC: 5.9 10*3/uL (ref 3.4–10.8)

## 2021-05-28 LAB — HEMOGLOBIN A1C
Est. average glucose Bld gHb Est-mCnc: 169 mg/dL
Hgb A1c MFr Bld: 7.5 % — ABNORMAL HIGH (ref 4.8–5.6)

## 2021-05-28 LAB — TSH: TSH: 3.06 u[IU]/mL (ref 0.450–4.500)

## 2021-05-28 MED ORDER — LOSARTAN POTASSIUM 50 MG PO TABS: 50.0000 mg | ORAL_TABLET | Freq: Every evening | ORAL | 1 refills | Status: DC

## 2021-05-28 NOTE — Patient Instructions (Addendum)
Medication Instructions:   START TAKING  LOSARTAN 50 MG ONCE A DAY    *If you need a refill on your cardiac medications before your next appointment, please call your pharmacy*   Lab Work:  CMET CBC TSH A1C AND LIPID TODAY    RETURN FOR BMET( MEDICATION CHANGE) IN 2 WEEKS   If you have labs (blood work) drawn today and your tests are completely normal, you will receive your results only by: MyChart Message (if you have MyChart) OR A paper copy in the mail If you have any lab test that is abnormal or we need to change your treatment, we will call you to review the results.   Testing/Procedures: NONE ORDERED  TODAY   Follow-Up: At [facility], you and your health needs are our priority.  As part of our continuing mission to provide you with exceptional heart care, we have created designated Provider Care Teams.  These Care Teams include your primary Cardiologist (physician) and Advanced Practice Providers (APPs -  Physician Assistants and Nurse Practitioners) who all work together to provide you with the care you need, when you need it.  We recommend signing up for the patient portal called "MyChart".  Sign up information is provided on this After Visit Summary.  MyChart is used to connect with patients for Virtual Visits (Telemedicine).  Patients are able to view lab/test results, encounter notes, upcoming appointments, etc.  Non-urgent messages can be sent to your provider as well.   To learn more about what you can do with MyChart, go to [email or web address].    Your next appointment:   3-4  month(s)  The format for your next appointment:   In Person  Provider:   Lesleigh Noe, MD  /APP    Other Instructions  MAKE SURE FOR NEXT TWO WEEKS TAKE AND MONITOR BLOOD PRESSURE AND SEND  READINGS TO MYCHART.   MAKE SURE YOU PURCHASE COMPRESSION HOES FOR EDEMA( SWELLING)

## 2021-05-28 NOTE — Addendum Note (Signed)
Addended by: Oleta Mouse on: 05/28/2021 11:02 AM   Modules accepted: Orders

## 2021-05-29 ENCOUNTER — Other Ambulatory Visit: Payer: Self-pay

## 2021-05-29 DIAGNOSIS — E785 Hyperlipidemia, unspecified: Secondary | ICD-10-CM

## 2021-05-29 MED ORDER — EZETIMIBE 10 MG PO TABS: 10.0000 mg | ORAL_TABLET | Freq: Every day | ORAL | 3 refills | Status: DC

## 2021-06-11 ENCOUNTER — Other Ambulatory Visit: Payer: Self-pay

## 2021-06-11 ENCOUNTER — Other Ambulatory Visit: Payer: No Typology Code available for payment source

## 2021-06-11 DIAGNOSIS — R600 Localized edema: Secondary | ICD-10-CM

## 2021-06-11 DIAGNOSIS — Z79899 Other long term (current) drug therapy: Secondary | ICD-10-CM

## 2021-06-12 LAB — BASIC METABOLIC PANEL
BUN/Creatinine Ratio: 16 (ref 9–20)
BUN: 21 mg/dL (ref 6–24)
CO2: 25 mmol/L (ref 20–29)
Calcium: 9.1 mg/dL (ref 8.7–10.2)
Chloride: 108 mmol/L — ABNORMAL HIGH (ref 96–106)
Creatinine, Ser: 1.32 mg/dL — ABNORMAL HIGH (ref 0.76–1.27)
Glucose: 154 mg/dL — ABNORMAL HIGH (ref 70–99)
Potassium: 5.3 mmol/L — ABNORMAL HIGH (ref 3.5–5.2)
Sodium: 144 mmol/L (ref 134–144)
eGFR: 66 mL/min/{1.73_m2} (ref >59)

## 2021-06-13 ENCOUNTER — Other Ambulatory Visit: Payer: Self-pay

## 2021-06-13 DIAGNOSIS — I10 Essential (primary) hypertension: Secondary | ICD-10-CM

## 2021-06-13 MED ORDER — AMLODIPINE BESYLATE 5 MG PO TABS: 5.0000 mg | ORAL_TABLET | Freq: Every day | ORAL | 3 refills | Status: DC

## 2021-06-13 MED ORDER — LOSARTAN POTASSIUM 25 MG PO TABS: 25.0000 mg | ORAL_TABLET | Freq: Every evening | ORAL | 3 refills | Status: DC

## 2021-06-25 ENCOUNTER — Telehealth: Payer: Self-pay | Admitting: Interventional Cardiology

## 2021-06-25 NOTE — Telephone Encounter (Signed)
Pt is scheduled for Lab work 06/26/21, pt was around someone that tested positive for Covid 1.5 weeks ago. Pt wants to know if he can still come for his lab work tomorrow.  Pt has no symptoms

## 2021-06-25 NOTE — Telephone Encounter (Signed)
Spoke with pt and made him aware if he remains asymptomatic, ok to come in for labs tomorrow since it has been a week and a half since exposure.  Pt appreciative for call.

## 2021-06-26 ENCOUNTER — Other Ambulatory Visit: Payer: Self-pay

## 2021-06-26 ENCOUNTER — Other Ambulatory Visit: Payer: No Typology Code available for payment source | Admitting: *Deleted

## 2021-06-26 DIAGNOSIS — I10 Essential (primary) hypertension: Secondary | ICD-10-CM

## 2021-06-26 LAB — BASIC METABOLIC PANEL
BUN/Creatinine Ratio: 15 (ref 9–20)
BUN: 18 mg/dL (ref 6–24)
CO2: 24 mmol/L (ref 20–29)
Calcium: 9.4 mg/dL (ref 8.7–10.2)
Chloride: 100 mmol/L (ref 96–106)
Creatinine, Ser: 1.22 mg/dL (ref 0.76–1.27)
Glucose: 173 mg/dL — ABNORMAL HIGH (ref 70–99)
Potassium: 4.6 mmol/L (ref 3.5–5.2)
Sodium: 136 mmol/L (ref 134–144)
eGFR: 73 mL/min/{1.73_m2} (ref >59)

## 2021-09-22 NOTE — Progress Notes (Signed)
?Cardiology Office Note:   ? ?Date:  09/23/2021  ? ?ID:  Adrian Graham, DOB 10/05/1970, MRN [id number] ? ?PCP:  Buckner Malta, MD  ?Cardiologist:  Lesleigh Noe, MD  ? ?Referring MD: Buckner Malta, MD  ? ?Chief Complaint  ?Patient presents with  ? Coronary Artery Disease  ? Hypertension  ? ? ?History of Present Illness:   ? ?Adrian Graham is a 51 y.o. male with a hx of  CAD s/p STEMI 2018 with PCI to LCx, AND recurrent angina 07/2017 CABG (LIMA to LAD, SVG to diag), pre-DM2, PRIMARY HYPERTENSION,  and hyperlipidemia.  ? ?Adrian Graham is doing well.  He is not having angina.  He had unacceptable lower extremity swelling on amlodipine.  He increased losartan from 25 mg/day to 50 mg/day and discontinued amlodipine.  Swelling has resolved. ? ?He does not follow his diet. ? ?He was unable to afford SGLT2 therapy. ? ?Inquires about whether carvedilol could become ineffective. ? ? ?Past Medical History:  ?Diagnosis Date  ? Acute myocardial infarction (HCC) 04/25/2017  ? Acute ST elevation myocardial infarction (STEMI) of inferior wall (HCC) 04/25/2017  ? Arthritis   ? "hands" (08/05/2017)  ? Coronary artery disease   ? Family history of early CAD 04/25/2017  ? Hyperlipidemia LDL goal <70 04/25/2017  ? Hypertension   ? Nasal congestion   ? Pre-diabetes   ? 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  LIMA to LAD SVG to DIAGONAL Open vein harvest RLL  ? ? ?Past Surgical History:  ?Procedure Laterality Date  ? CARDIAC CATHETERIZATION    ? CARPAL TUNNEL RELEASE Left   ? CORONARY ARTERY BYPASS GRAFT N/A 08/06/2017  ? Procedure: OFF PUMP CORONARY ARTERY BYPASS GRAFTING (CABG) x 2 WITH OPEN HARVESTING OF RIGHT LOWER SAPHENOUS VEIN;  Surgeon: Purcell Nails, MD;  Location: [facility] OR;  Service: Open Heart Surgery;  Laterality: N/A;  ? CORONARY/GRAFT ACUTE MI REVASCULARIZATION N/A 04/25/2017  ? Procedure: Coronary/Graft Acute MI Revascularization;  Surgeon: Lyn Records, MD;  Location: [facility] INVASIVE CV LAB;  Service: Cardiovascular;  Laterality:  N/A;  ? LEFT HEART CATH AND CORONARY ANGIOGRAPHY N/A 04/25/2017  ? Procedure: LEFT HEART CATH AND CORONARY ANGIOGRAPHY;  Surgeon: Lyn Records, MD;  Location: [facility] INVASIVE CV LAB;  Service: Cardiovascular;  Laterality: N/A;  ? LEFT HEART CATH AND CORONARY ANGIOGRAPHY N/A 07/28/2017  ? Procedure: LEFT HEART CATH AND CORONARY ANGIOGRAPHY;  Surgeon: Lyn Records, MD;  Location: [facility] INVASIVE CV LAB;  Service: Cardiovascular;  Laterality: N/A;  ? TEE WITHOUT CARDIOVERSION N/A 08/06/2017  ? Procedure: TRANSESOPHAGEAL ECHOCARDIOGRAM (TEE);  Surgeon: Purcell Nails, MD;  Location: [facility] OR;  Service: Open Heart Surgery;  Laterality: N/A;  ? WISDOM TOOTH EXTRACTION    ? ? ?Current Medications: ?Current Meds  ?Medication Sig  ? aspirin 81 MG chewable tablet Chew 1 tablet (81 mg total) daily by mouth.  ? atorvastatin (LIPITOR) 80 MG tablet TAKE 1 TABLET BY MOUTH EVERY DAY  ? carvedilol (COREG) 12.5 MG tablet TAKE 1 TABLET BY MOUTH 2 TIMES DAILY.  ? ezetimibe (ZETIA) 10 MG tablet Take 1 tablet (10 mg total) by mouth daily.  ? glipiZIDE (GLUCOTROL) 5 MG tablet Take 5 mg by mouth every morning.  ? losartan (COZAAR) 50 MG tablet Take 50 mg by mouth in the morning and at bedtime.  ? nitroGLYCERIN (NITROSTAT) 0.4 MG SL tablet Place 1 tablet (0.4 mg total) every 5 (five) minutes as needed under the tongue for chest  pain.  ?  ? ?Allergies:   Metformin and related  ? ?Social History  ? ?Socioeconomic History  ? Marital status: Divorced  ?  Spouse name: Not on file  ? Number of children: Not on file  ? Years of education: Not on file  ? Highest education level: Not on file  ?Occupational History  ? Not on file  ?Tobacco Use  ? Smoking status: Former  ?  Packs/day: 1.00  ?  Years: 10.00  ?  Pack years: 10.00  ?  Types: E-cigarettes, Cigarettes  ?  Quit date: 05/02/2017  ?  Years since quitting: 4.3  ? Smokeless tobacco: Former  ?  Types: Chew  ?Vaping Use  ? Vaping Use: Former  ? Quit date: 05/02/2017  ?Substance and Sexual Activity  ?  Alcohol use: No  ? Drug use: No  ? Sexual activity: Not Currently  ?Other Topics Concern  ? Not on file  ?Social History Narrative  ? Not on file  ? ?Social Determinants of Health  ? ?Financial Resource Strain: Not on file  ?Food Insecurity: Not on file  ?Transportation Needs: Not on file  ?Physical Activity: Not on file  ?Stress: Not on file  ?Social Connections: Not on file  ?  ? ?Family History: ?The patient's family history includes CAD in his brother; CAD (age of onset: 87) in his mother; Cancer in his brother; Heart attack in his brother and brother; Heart attack (age of onset: 64) in his father. ? ?ROS:   ?Please see the history of present illness.    ?No dyspnea.  Edema has resolved.  Denies palpitations.  No orthopnea.  No angina.  All other systems reviewed and are negative. ? ?EKGs/Labs/Other Studies Reviewed:   ? ?The following studies were reviewed today: ?No imaging data ? ?EKG:  EKG not repeated.  When performed on 05/28/2021, the tracing revealed normal sinus rhythm with no abnormality. ? ?Recent Labs: ?05/28/2021: ALT 20; Hemoglobin 13.7; Platelets 169; TSH 3.060 ?06/26/2021: BUN 18; Creatinine, Ser 1.22; Potassium 4.6; Sodium 136  ?Recent Lipid Panel ?   ?Component Value Date/Time  ? CHOL 144 05/28/2021 1123  ? TRIG 83 05/28/2021 1123  ? HDL 41 05/28/2021 1123  ? CHOLHDL 3.5 05/28/2021 1123  ? CHOLHDL 8.4 04/25/2017 0909  ? VLDL UNABLE TO CALCULATE IF TRIGLYCERIDE OVER 400 mg/dL 29/93/7169 6789  ? LDLCALC 87 05/28/2021 1123  ? ? ?Physical Exam:   ? ?VS:  BP 136/90   Pulse 79   Ht 5' 9.5" (1.765 m)   Wt 211 lb 6.4 oz (95.9 kg)   SpO2 96%   BMI 30.77 kg/m?    ? ?Wt Readings from Last 3 Encounters:  ?09/23/21 211 lb 6.4 oz (95.9 kg)  ?05/28/21 214 lb (97.1 kg)  ?07/25/20 216 lb 3.2 oz (98.1 kg)  ?  ? ?GEN: Overweight. No acute distress ?HEENT: Normal ?NECK: No JVD. ?LYMPHATICS: No lymphadenopathy ?CARDIAC: No murmur. RRR no gallop, or edema. ?VASCULAR:  Normal Pulses. No bruits. ?RESPIRATORY:  Clear  to auscultation without rales, wheezing or rhonchi  ?ABDOMEN: Soft, non-tender, non-distended, No pulsatile mass, ?MUSCULOSKELETAL: No deformity  ?SKIN: Warm and dry ?NEUROLOGIC:  Alert and oriented x 3 ?PSYCHIATRIC:  Normal affect  ? ?ASSESSMENT:   ? ?1. Coronary artery disease involving native coronary artery of native heart without angina pectoris   ?2. Essential hypertension   ?3. Hyperlipidemia, unspecified hyperlipidemia type   ?4. Obesity (BMI 30.0-34.9)   ?5. Primary hypertension   ?6. Hyperlipidemia LDL  goal <70   ? ?PLAN:   ? ?In order of problems listed above: ? ?Secondary prevention and metrics for risk factors were reviewed.  Aerobic activity to achieve at least 150 minutes of moderate exercise is advocated. ?Target 130/80 mmHg or less.  Adjustments in medications could include increasing carvedilol to 25 mg twice a day or adding HCTZ 12.5 mg daily to losartan. ?LDL target less than 70.  Currently on high intensity Lipitor and Zetia.  He has a pending cholesterol.  He is made aware of target goal.  If he remains in the 80 or above range, we need to intensify therapy.  I strongly encouraged him to adhere to a low-fat low carbohydrate diet with increased fiber. ?Weight loss and exercise encouraged ? ? ?Overall education and awareness concerning secondary risk prevention was discussed in detail: LDL less than 70, hemoglobin A1c less than 7, blood pressure target less than 130/80 mmHg, >150 minutes of moderate aerobic activity per week, avoidance of smoking, weight control (via diet and exercise), and continued surveillance/management of/for obstructive sleep apnea. ? ? ?Medication Adjustments/Labs and Tests Ordered: ?Current medicines are reviewed at length with the patient today.  Concerns regarding medicines are outlined above.  ?No orders of the defined types were placed in this encounter. ? ?No orders of the defined types were placed in this encounter. ? ? ?There are no Patient Instructions on file for  this visit.  ? ?Signed, ?Lesleigh Noe, MD  ?09/23/2021 4:57 PM    ?[facility] ?

## 2021-09-23 ENCOUNTER — Encounter: Payer: Self-pay | Admitting: Interventional Cardiology

## 2021-09-23 ENCOUNTER — Ambulatory Visit (INDEPENDENT_AMBULATORY_CARE_PROVIDER_SITE_OTHER): Payer: No Typology Code available for payment source | Admitting: Interventional Cardiology

## 2021-09-23 VITALS — BP 136/90 | HR 79 | Ht 69.5 in | Wt 211.4 lb

## 2021-09-23 DIAGNOSIS — E669 Obesity, unspecified: Secondary | ICD-10-CM | POA: Diagnosis not present

## 2021-09-23 DIAGNOSIS — E785 Hyperlipidemia, unspecified: Secondary | ICD-10-CM

## 2021-09-23 DIAGNOSIS — E66811 Obesity, class 1: Secondary | ICD-10-CM

## 2021-09-23 DIAGNOSIS — I2510 Atherosclerotic heart disease of native coronary artery without angina pectoris: Secondary | ICD-10-CM | POA: Diagnosis not present

## 2021-09-23 DIAGNOSIS — I10 Essential (primary) hypertension: Secondary | ICD-10-CM | POA: Diagnosis not present

## 2021-09-23 NOTE — Patient Instructions (Signed)

## 2021-11-22 ENCOUNTER — Other Ambulatory Visit: Payer: Self-pay | Admitting: Interventional Cardiology

## 2021-12-03 ENCOUNTER — Other Ambulatory Visit: Payer: Self-pay | Admitting: Interventional Cardiology

## 2021-12-04 MED ORDER — LOSARTAN POTASSIUM 50 MG PO TABS
50.0000 mg | ORAL_TABLET | Freq: Two times a day (BID) | ORAL | 3 refills | Status: AC
Start: 2021-12-04 — End: ?

## 2021-12-19 ENCOUNTER — Other Ambulatory Visit: Payer: Self-pay | Admitting: Interventional Cardiology

## 2022-05-22 ENCOUNTER — Other Ambulatory Visit: Payer: Self-pay | Admitting: Physician Assistant

## 2022-07-07 ENCOUNTER — Other Ambulatory Visit: Payer: Self-pay | Admitting: Physician Assistant

## 2022-10-02 ENCOUNTER — Other Ambulatory Visit: Payer: Self-pay

## 2022-10-02 MED ORDER — ATORVASTATIN CALCIUM 80 MG PO TABS: 80.0000 mg | ORAL_TABLET | Freq: Every day | ORAL | 0 refills | Status: DC

## 2022-10-31 ENCOUNTER — Other Ambulatory Visit: Payer: Self-pay | Admitting: Physician Assistant

## 2022-10-31 MED ORDER — ATORVASTATIN CALCIUM 80 MG PO TABS: 80.0000 mg | ORAL_TABLET | Freq: Every day | ORAL | 0 refills | Status: DC

## 2022-11-04 ENCOUNTER — Other Ambulatory Visit: Payer: Self-pay | Admitting: Physician Assistant

## 2022-11-19 ENCOUNTER — Other Ambulatory Visit: Payer: Self-pay | Admitting: *Deleted

## 2022-11-19 MED ORDER — CARVEDILOL 12.5 MG PO TABS: 12.5000 mg | ORAL_TABLET | Freq: Two times a day (BID) | ORAL | 0 refills | Status: DC

## 2022-11-20 ENCOUNTER — Other Ambulatory Visit: Payer: Self-pay | Admitting: Physician Assistant

## 2022-11-21 ENCOUNTER — Other Ambulatory Visit: Payer: Self-pay | Admitting: Physician Assistant

## 2022-12-18 ENCOUNTER — Other Ambulatory Visit: Payer: Self-pay | Admitting: Internal Medicine

## 2022-12-22 ENCOUNTER — Telehealth: Payer: Self-pay | Admitting: Physician Assistant

## 2022-12-22 MED ORDER — ATORVASTATIN CALCIUM 80 MG PO TABS: 80.0000 mg | ORAL_TABLET | Freq: Every day | ORAL | 1 refills | Status: AC

## 2022-12-22 NOTE — Telephone Encounter (Signed)
Pt's medication was sent to pt's pharmacy as requested. Confirmation received.  °

## 2022-12-22 NOTE — Telephone Encounter (Signed)
*  STAT* If patient is at the pharmacy, call can be transferred to refill team.   1. Which medications need to be refilled? (please list name of each medication and dose if known) Atorvatatin  2. Which pharmacy/location (including street and city if local pharmacy) is medication to be sent to? [facility] [place]  3. Do they need a 30 day or 90 day supply? Enough until his appointment on 02-18-23- please call in today- out of medicine

## 2023-01-21 ENCOUNTER — Other Ambulatory Visit: Payer: Self-pay | Admitting: Internal Medicine

## 2023-02-17 ENCOUNTER — Encounter: Payer: Self-pay | Admitting: Physician Assistant

## 2023-02-17 NOTE — Progress Notes (Deleted)
Cardiology Office Note    Date:  02/17/2023  ID:  TRAYCEN Adrian Graham, DOB 18-Jul-1970, MRN [id number] PCP:  Adrian Malta, MD  Cardiologist:  Adrian Noe, MD (Inactive)  Electrophysiologist:  None   Chief Complaint: ***  History of Present Illness: .    Adrian Graham is a 52 y.o. male with visit-pertinent history of CAD s/p STEMI 2018 with PCI to LCx with recurrent angina 07/2017 CABG (LIMA to LAD, SVG to diag), chronic HFrEF (EF 35-40%), mild MR, DM, CKD stage 2, HTN, HLD who is seen for follow-up. He has h/o edema with amlodipine. He was previously unable to afford SGLT2i. Last assessment of LV function was by TEE at time of surgery in 2019 showing EF 35-40%, mild MR. This also reported small PFO with left-to-right shunt and PFO closure device present? There is no mention of this in the operative note. Carotid US 2019 showed minimal plaque only.  Labs overudue  CAD, HLD Chronic HFrEF, HTN Mild MR ? PFO closure  Labwork independently reviewed: 06/2021 K 4.6, 1.22 2022 LFTs wnl, LDL 87, trig 83, A1c 7.5, TSH ok, CBC wnl  ROS: .    Please see the history of present illness. Otherwise, review of systems is positive for ***.  All other systems are reviewed and otherwise negative.  Studies Reviewed: Marland Kitchen    EKG:  EKG is ordered today, personally reviewed, demonstrating ***  CV Studies: Cardiac studies reviewed are outlined and summarized above. Otherwise please see EMR for full report.   Current Reported Medications:.    No outpatient medications have been marked as taking for the 02/18/23 encounter (Appointment) with Laurann Montana, PA-C.    Physical Exam:    VS:  There were no vitals taken for this visit.   Wt Readings from Last 3 Encounters:  09/23/21 211 lb 6.4 oz (95.9 kg)  05/28/21 214 lb (97.1 kg)  07/25/20 216 lb 3.2 oz (98.1 kg)    GEN: Well nourished, well developed in no acute distress NECK: No JVD; No carotid bruits CARDIAC: ***RRR, no murmurs, rubs,  gallops RESPIRATORY:  Clear to auscultation without rales, wheezing or rhonchi  ABDOMEN: Soft, non-tender, non-distended EXTREMITIES:  No edema; No acute deformity   Asessement and Plan:.     ***     Disposition: F/u with ***  Signed, Laurann Montana, PA-C

## 2023-02-18 ENCOUNTER — Ambulatory Visit: Payer: No Typology Code available for payment source | Attending: Physician Assistant | Admitting: Physician Assistant

## 2023-02-18 DIAGNOSIS — Q2112 Patent foramen ovale: Secondary | ICD-10-CM

## 2023-02-18 DIAGNOSIS — I10 Essential (primary) hypertension: Secondary | ICD-10-CM

## 2023-02-18 DIAGNOSIS — E785 Hyperlipidemia, unspecified: Secondary | ICD-10-CM

## 2023-02-18 DIAGNOSIS — I340 Nonrheumatic mitral (valve) insufficiency: Secondary | ICD-10-CM

## 2023-02-18 DIAGNOSIS — I5022 Chronic systolic (congestive) heart failure: Secondary | ICD-10-CM

## 2023-02-18 DIAGNOSIS — I2510 Atherosclerotic heart disease of native coronary artery without angina pectoris: Secondary | ICD-10-CM
